# Patient Record
Sex: Female | Born: 1990 | Hispanic: No | Marital: Single | Smoking: Former smoker
Health system: Southern US, Community
[De-identification: ages and names within clinical notes are randomized; demographics above are authoritative.]

## PROBLEM LIST (undated history)

## (undated) DIAGNOSIS — K859 Acute pancreatitis without necrosis or infection, unspecified: Secondary | ICD-10-CM

## (undated) DIAGNOSIS — Z30017 Encounter for initial prescription of implantable subdermal contraceptive: Principal | ICD-10-CM

## (undated) DIAGNOSIS — F32A Depression, unspecified: Secondary | ICD-10-CM

## (undated) DIAGNOSIS — J45909 Unspecified asthma, uncomplicated: Secondary | ICD-10-CM

## (undated) DIAGNOSIS — F329 Major depressive disorder, single episode, unspecified: Secondary | ICD-10-CM

## (undated) DIAGNOSIS — Z975 Presence of (intrauterine) contraceptive device: Secondary | ICD-10-CM

## (undated) DIAGNOSIS — L309 Dermatitis, unspecified: Secondary | ICD-10-CM

## (undated) DIAGNOSIS — Z7689 Persons encountering health services in other specified circumstances: Secondary | ICD-10-CM

## (undated) DIAGNOSIS — E559 Vitamin D deficiency, unspecified: Secondary | ICD-10-CM

## (undated) DIAGNOSIS — R51 Headache: Principal | ICD-10-CM

## (undated) HISTORY — DX: Acute pancreatitis without necrosis or infection, unspecified: K85.90

## (undated) HISTORY — DX: Vitamin D deficiency, unspecified: E55.9

## (undated) HISTORY — DX: Dermatitis, unspecified: L30.9

## (undated) HISTORY — DX: Unspecified asthma, uncomplicated: J45.909

## (undated) HISTORY — DX: Encounter for initial prescription of implantable subdermal contraceptive: Z30.017

## (undated) HISTORY — DX: Headache: R51

## (undated) HISTORY — DX: Presence of (intrauterine) contraceptive device: Z97.5

## (undated) HISTORY — DX: Persons encountering health services in other specified circumstances: Z76.89

---

## 2012-10-15 ENCOUNTER — Encounter: Payer: Self-pay | Admitting: *Deleted

## 2012-10-15 DIAGNOSIS — L309 Dermatitis, unspecified: Secondary | ICD-10-CM

## 2012-10-15 DIAGNOSIS — J45909 Unspecified asthma, uncomplicated: Secondary | ICD-10-CM | POA: Insufficient documentation

## 2012-10-16 ENCOUNTER — Ambulatory Visit: Payer: Self-pay | Admitting: Adult Health

## 2014-01-22 ENCOUNTER — Ambulatory Visit (INDEPENDENT_AMBULATORY_CARE_PROVIDER_SITE_OTHER): Payer: BC Managed Care – PPO | Admitting: Adult Health

## 2014-01-22 ENCOUNTER — Other Ambulatory Visit (HOSPITAL_COMMUNITY)
Admission: RE | Admit: 2014-01-22 | Discharge: 2014-01-22 | Disposition: A | Discharge status: Home / Self Care | Payer: BC Managed Care – PPO | Source: Ambulatory Visit | Attending: Adult Health | Admitting: Adult Health

## 2014-01-22 ENCOUNTER — Encounter: Payer: Self-pay | Admitting: Adult Health

## 2014-01-22 VITALS — BP 118/76 | HR 72 | Ht 64.0 in | Wt 197.0 lb

## 2014-01-22 DIAGNOSIS — Z1212 Encounter for screening for malignant neoplasm of rectum: Secondary | ICD-10-CM

## 2014-01-22 DIAGNOSIS — Z7689 Persons encountering health services in other specified circumstances: Secondary | ICD-10-CM

## 2014-01-22 DIAGNOSIS — Z01419 Encounter for gynecological examination (general) (routine) without abnormal findings: Secondary | ICD-10-CM | POA: Insufficient documentation

## 2014-01-22 DIAGNOSIS — Z3200 Encounter for pregnancy test, result unknown: Secondary | ICD-10-CM

## 2014-01-22 DIAGNOSIS — Z113 Encounter for screening for infections with a predominantly sexual mode of transmission: Secondary | ICD-10-CM | POA: Insufficient documentation

## 2014-01-22 HISTORY — DX: Persons encountering health services in other specified circumstances: Z76.89

## 2014-01-22 LAB — POCT URINE PREGNANCY: Preg Test, Ur: NEGATIVE

## 2014-01-22 MED ORDER — NORETHIN ACE-ETH ESTRAD-FE 1-20 MG-MCG(24) PO CHEW: CHEWABLE_TABLET | ORAL | Status: DC

## 2014-01-22 NOTE — Progress Notes (Signed)
Patient ID: [name], female   DOB: Jun 15, 1991, 23 y.o.   MRN: [id number] History of Present Illness: Erika Lyons is a 23 year old black female, single, in for her first pap and physical.She has had discharge with some itching but stopped using shaving cream and it got better.She started her period at age 23 and it is regular but can be heavy at times and some cramps,has not had sex.Has used tampon,but usually pads and may change hourly on heaviest day.She is thinking of going to [employer].   Current Medications, Allergies, Past Medical History, Past Surgical History, Family History and Social History were reviewed in [facility] electronic medical record.     Review of Systems: Patient denies any daily headaches, blurred vision, shortness of breath, chest pain, abdominal pain, problems with bowel movements, urination, or intercourse. Has not had sex, no joint pian or mood swings, see HPI for positives.    Physical Exam:BP 118/76  Pulse 72  Ht 5\' 4"  (1.626 m)  Wt 197 lb (89.359 kg)  BMI 33.80 kg/m2  LMP 07/27/2015UPT negative General:  Well developed, well nourished, no acute distress Skin:  Warm and dry, has tattoos Neck:  Midline trachea, normal thyroid Lungs; Clear to auscultation bilaterally Breast:  No dominant palpable mass, retraction, or nipple discharge, nipples inverted bilaterally Cardiovascular: Regular rate and rhythm Abdomen:  Soft, non tender, no hepatosplenomegaly Pelvic:  External genitalia is normal in appearance, no lesions.  The vagina is pink,without discahrge.The cervix is smooth and tiny, pap with GC/CHL.  Uterus is felt to be normal size, shape, and contour.  No  adnexal masses or tenderness noted. Extremities:  No swelling or varicosities noted Psych:  No mood changes,alert and cooperative,seems happy Discussed OCs and depo,nexplanon,skyla and ring and she wants to try OCs.  Impression: Yearly gyn exam Period management    Plan: Rx Minastrin disp 1 pack  take 1 daily with 11 refills, gave 1 pack to start today, lot [id number], exp5/16 And discount card,If has sex use condoms Review handout on OC use and nexplanon Return in 3 months for follow up  Physical in 1 year ,pap in 2-3 years if normal

## 2014-01-22 NOTE — Patient Instructions (Signed)
Start OC today take same time daily If has sex use condoms Follow up in 3 months Physical in 1 year Cut carbs and increase exercise Oral Contraception Use Oral contraceptive pills (OCPs) are medicines taken to prevent pregnancy. OCPs work by preventing the ovaries from releasing eggs. The hormones in OCPs also cause the cervical mucus to thicken, preventing the sperm from entering the uterus. The hormones also cause the uterine lining to become thin, not allowing a fertilized egg to attach to the inside of the uterus. OCPs are highly effective when taken exactly as prescribed. However, OCPs do not prevent sexually transmitted diseases (STDs). Safe sex practices, such as using condoms along with an OCP, can help prevent STDs. Before taking OCPs, you may have a physical exam and Pap test. Your health care provider may also order blood tests if necessary. Your health care provider will make sure you are a good candidate for oral contraception. Discuss with your health care provider the possible side effects of the OCP you may be prescribed. When starting an OCP, it can take 2 to 3 months for the body to adjust to the changes in hormone levels in your body.  HOW TO TAKE ORAL CONTRACEPTIVE PILLS Your health care provider may advise you on how to start taking the first cycle of OCPs. Otherwise, you can:   Start on day 1 of your menstrual period. You will not need any backup contraceptive protection with this start time.   Start on the first Sunday after your menstrual period or the day you get your prescription. In these cases, you will need to use backup contraceptive protection for the first week.   Start the pill at any time of your cycle. If you take the pill within 5 days of the start of your period, you are protected against pregnancy right away. In this case, you will not need a backup form of birth control. If you start at any other time of your menstrual cycle, you will need to use another form  of birth control for 7 days. If your OCP is the type called a minipill, it will protect you from pregnancy after taking it for 2 days (48 hours). After you have started taking OCPs:   If you forget to take 1 pill, take it as soon as you remember. Take the next pill at the regular time.   If you miss 2 or more pills, call your health care provider because different pills have different instructions for missed doses. Use backup birth control until your next menstrual period starts.   If you use a 28-day pack that contains inactive pills and you miss 1 of the last 7 pills (pills with no hormones), it will not matter. Throw away the rest of the non-hormone pills and start a new pill pack.  No matter which day you start the OCP, you will always start a new pack on that same day of the week. Have an extra pack of OCPs and a backup contraceptive method available in case you miss some pills or lose your OCP pack.  HOME CARE INSTRUCTIONS   Do not smoke.   Always use a condom to protect against STDs. OCPs do not protect against STDs.   Use a calendar to mark your menstrual period days.   Read the information and directions that came with your OCP. Talk to your health care provider if you have questions.  SEEK MEDICAL CARE IF:   You develop nausea and vomiting.  You have abnormal vaginal discharge or bleeding.   You develop a rash.   You miss your menstrual period.   You are losing your hair.   You need treatment for mood swings or depression.   You get dizzy when taking the OCP.   You develop acne from taking the OCP.   You become pregnant.  SEEK IMMEDIATE MEDICAL CARE IF:   You develop chest pain.   You develop shortness of breath.   You have an uncontrolled or severe headache.   You develop numbness or slurred speech.   You develop visual problems.   You develop pain, redness, and swelling in the legs.  Document Released: 05/26/2011 Document Revised:  10/21/2013 Document Reviewed: 11/25/2012 [facility] Patient Information 2015 [employer] [place]. This information is not intended to replace advice given to you by your health care provider. Make sure you discuss any questions you have with your health care provider.

## 2014-01-24 LAB — CYTOLOGY - PAP

## 2014-03-20 ENCOUNTER — Encounter (HOSPITAL_COMMUNITY): Payer: Self-pay | Admitting: Emergency Medicine

## 2014-03-20 ENCOUNTER — Emergency Department (HOSPITAL_COMMUNITY)
Admission: EM | Admit: 2014-03-20 | Discharge: 2014-03-21 | Disposition: A | Discharge status: Other Acute Inpatient Hospital | Payer: BC Managed Care – PPO | Attending: Emergency Medicine | Admitting: Emergency Medicine

## 2014-03-20 DIAGNOSIS — Z3202 Encounter for pregnancy test, result negative: Secondary | ICD-10-CM | POA: Insufficient documentation

## 2014-03-20 DIAGNOSIS — T368X2A Poisoning by other systemic antibiotics, intentional self-harm, initial encounter: Secondary | ICD-10-CM | POA: Diagnosis not present

## 2014-03-20 DIAGNOSIS — Z79899 Other long term (current) drug therapy: Secondary | ICD-10-CM | POA: Diagnosis not present

## 2014-03-20 DIAGNOSIS — J45909 Unspecified asthma, uncomplicated: Secondary | ICD-10-CM | POA: Insufficient documentation

## 2014-03-20 DIAGNOSIS — T50901A Poisoning by unspecified drugs, medicaments and biological substances, accidental (unintentional), initial encounter: Secondary | ICD-10-CM | POA: Diagnosis present

## 2014-03-20 DIAGNOSIS — F322 Major depressive disorder, single episode, severe without psychotic features: Secondary | ICD-10-CM

## 2014-03-20 DIAGNOSIS — Z872 Personal history of diseases of the skin and subcutaneous tissue: Secondary | ICD-10-CM | POA: Insufficient documentation

## 2014-03-20 DIAGNOSIS — F329 Major depressive disorder, single episode, unspecified: Secondary | ICD-10-CM | POA: Diagnosis present

## 2014-03-20 DIAGNOSIS — T450X2A Poisoning by antiallergic and antiemetic drugs, intentional self-harm, initial encounter: Secondary | ICD-10-CM | POA: Insufficient documentation

## 2014-03-20 DIAGNOSIS — F99 Mental disorder, not otherwise specified: Secondary | ICD-10-CM | POA: Diagnosis present

## 2014-03-20 DIAGNOSIS — T378X2A Poisoning by other specified systemic anti-infectives and antiparasitics, intentional self-harm, initial encounter: Secondary | ICD-10-CM | POA: Insufficient documentation

## 2014-03-20 HISTORY — DX: Depression, unspecified: F32.A

## 2014-03-20 HISTORY — DX: Major depressive disorder, single episode, unspecified: F32.9

## 2014-03-20 LAB — BASIC METABOLIC PANEL
Anion gap: 13 (ref 5–15)
BUN: 8 mg/dL (ref 6–23)
CALCIUM: 8.9 mg/dL (ref 8.4–10.5)
CO2: 24 mEq/L (ref 19–32)
Chloride: 102 mEq/L (ref 96–112)
Creatinine, Ser: 0.85 mg/dL (ref 0.50–1.10)
GFR calc Af Amer: >90 mL/min (ref >90)
Glucose, Bld: 83 mg/dL (ref 70–99)
Potassium: 4.4 mEq/L (ref 3.7–5.3)
Sodium: 139 mEq/L (ref 137–147)

## 2014-03-20 LAB — RAPID URINE DRUG SCREEN, HOSP PERFORMED
AMPHETAMINES: NOT DETECTED
Barbiturates: NOT DETECTED
Benzodiazepines: NOT DETECTED
Cocaine: NOT DETECTED
Opiates: NOT DETECTED
TETRAHYDROCANNABINOL: NOT DETECTED

## 2014-03-20 LAB — COMPREHENSIVE METABOLIC PANEL
ALT: 10 U/L (ref 0–35)
AST: 26 U/L (ref 0–37)
Albumin: 3.9 g/dL (ref 3.5–5.2)
Alkaline Phosphatase: 71 U/L (ref 39–117)
Anion gap: 12 (ref 5–15)
BILIRUBIN TOTAL: 1.7 mg/dL — AB (ref 0.3–1.2)
BUN: 9 mg/dL (ref 6–23)
CHLORIDE: 102 meq/L (ref 96–112)
CO2: 24 mEq/L (ref 19–32)
CREATININE: 0.84 mg/dL (ref 0.50–1.10)
Calcium: 9.2 mg/dL (ref 8.4–10.5)
GFR calc non Af Amer: >90 mL/min (ref >90)
GLUCOSE: 91 mg/dL (ref 70–99)
Potassium: 4.8 mEq/L (ref 3.7–5.3)
Sodium: 138 mEq/L (ref 137–147)
Total Protein: 7.3 g/dL (ref 6.0–8.3)

## 2014-03-20 LAB — URINALYSIS, ROUTINE W REFLEX MICROSCOPIC
Glucose, UA: NEGATIVE mg/dL
Hgb urine dipstick: NEGATIVE
Ketones, ur: >80 mg/dL — AB
Leukocytes, UA: NEGATIVE
Nitrite: NEGATIVE
PROTEIN: NEGATIVE mg/dL
SPECIFIC GRAVITY, URINE: 1.028 (ref 1.005–1.030)
UROBILINOGEN UA: 1 mg/dL (ref 0.0–1.0)
pH: 6.5 (ref 5.0–8.0)

## 2014-03-20 LAB — SALICYLATE LEVEL: Salicylate Lvl: <2 mg/dL — ABNORMAL LOW (ref 2.8–20.0)

## 2014-03-20 LAB — PREGNANCY, URINE: PREG TEST UR: NEGATIVE

## 2014-03-20 LAB — CBC
HEMATOCRIT: 38.6 % (ref 36.0–46.0)
Hemoglobin: 12.7 g/dL (ref 12.0–15.0)
MCH: 28.2 pg (ref 26.0–34.0)
MCHC: 32.9 g/dL (ref 30.0–36.0)
MCV: 85.8 fL (ref 78.0–100.0)
PLATELETS: 307 10*3/uL (ref 150–400)
RBC: 4.5 MIL/uL (ref 3.87–5.11)
RDW: 13.1 % (ref 11.5–15.5)
WBC: 9.6 10*3/uL (ref 4.0–10.5)

## 2014-03-20 LAB — ACETAMINOPHEN LEVEL: Acetaminophen (Tylenol), Serum: <15 ug/mL (ref 10–30)

## 2014-03-20 LAB — ETHANOL: Alcohol, Ethyl (B): <11 mg/dL (ref 0–11)

## 2014-03-20 MED ORDER — TRAZODONE HCL 50 MG PO TABS
50.0000 mg | ORAL_TABLET | Freq: Every evening | ORAL | Status: DC | PRN
  Administered 2014-03-20: 50 mg via ORAL
  Filled 2014-03-20: qty 1

## 2014-03-20 MED ORDER — SODIUM CHLORIDE 0.9 % IV BOLUS (SEPSIS)
1000.0000 mL | Freq: Once | INTRAVENOUS | Status: AC
  Administered 2014-03-20: 1000 mL via INTRAVENOUS

## 2014-03-20 NOTE — ED Notes (Signed)
Stephanie from poison control reports closing case.

## 2014-03-20 NOTE — ED Notes (Signed)
Ice water given to pt °

## 2014-03-20 NOTE — BH Assessment (Signed)
[facility]    Consult requested by Dr Hyacinth MeekerMiller at 1630 who reports the patient is experiencing relationship problems and took a handful of pills.  She took picture of them and then texted it to a friend prior to taking. WHen mother arrived pt was unresponsive.  pt confirms it was a suicide attempt.

## 2014-03-20 NOTE — ED Provider Notes (Signed)
CSN: [id number]     Arrival date & time 03/20/14  1145 History   First MD Initiated Contact with Patient 03/20/14 1153     Chief Complaint  Patient presents with  . Ingestion  . Suicidal     (Consider location/radiation/quality/duration/timing/severity/associated sxs/prior Treatment) HPI Comments: This 23 year old female presents after suicide attempt where she took an overdose of multiple medications, she took a text message picture of her hand holding at least 15 pills of 800 mg Motrin and clindamycin each with 15 tablets, she was found unresponsive on the floor by her mother who went home after receiving a text message picture. The patient was unresponsive on the floor, there were multiple pill bottles which were empty beside her including 5 different antibiotics. The patient states that she is upset, is having trouble, she does not give much more information but the family believes she has recently gone through a breakup possibly. The patient denies any other substance abuse, there has been no aspirin or Tylenol ingestion per the patient's report and this occurred in the last couple of hours. The patient has no psychiatric history including no history of talking about depression suicide or suicide attempts, she did have some comments this morning to a friend which prompted the mother to go home to look for her daughter.  Patient is a 23 y.o. female presenting with Ingested Medication. The history is provided by the patient.  Ingestion    Past Medical History  Diagnosis Date  . Asthma   . Eczema   . Menstrual extraction 01/22/2014   History reviewed. No pertinent past surgical history. Family History  Problem Relation Age of Onset  . Hypertension Other   . Heart disease Maternal Grandfather   . Cancer Paternal Grandmother     breast    History  Substance Use Topics  . Smoking status: Never Smoker   . Smokeless tobacco: Never Used  . Alcohol Use: No     Comment: occ.   OB  History   Grav Para Term Preterm Abortions TAB SAB Ect Mult Living   0 0             Review of Systems  All other systems reviewed and are negative.     Allergies  Review of patient's allergies indicates no known allergies.  Home Medications   Prior to Admission medications   Medication Sig Start Date End Date Taking? Authorizing Provider  Norethin Ace-Eth Estrad-FE (MINASTRIN 24 FE) 1-20 MG-MCG(24) CHEW Take 1 po daily 01/22/14  Yes Adline Potter, NP   BP 98/57  Pulse 61  Temp(Src) 98.3 F (36.8 C) (Oral)  Resp 16  SpO2 98%  LMP 03/06/2014 Physical Exam  Nursing note and vitals reviewed. Constitutional: She appears well-developed and well-nourished. No distress.  HENT:  Head: Normocephalic and atraumatic.  Mouth/Throat: Oropharynx is clear and moist. No oropharyngeal exudate.  Eyes: Conjunctivae and EOM are normal. Pupils are equal, round, and reactive to light. Right eye exhibits no discharge. Left eye exhibits no discharge. No scleral icterus.  Neck: Normal range of motion. Neck supple. No JVD present. No thyromegaly present.  Cardiovascular: Normal rate, regular rhythm, normal heart sounds and intact distal pulses.  Exam reveals no gallop and no friction rub.   No murmur heard. Pulmonary/Chest: Effort normal and breath sounds normal. No respiratory distress. She has no wheezes. She has no rales.  Abdominal: Soft. Bowel sounds are normal. She exhibits no distension and no mass. There is tenderness (mild tenderness to  the right upper quadrant and epigastrium, no guarding).  Musculoskeletal: Normal range of motion. She exhibits no edema and no tenderness.  Lymphadenopathy:    She has no cervical adenopathy.  Neurological: She is alert. Coordination normal.  Skin: Skin is warm and dry. No rash noted. No erythema.  Psychiatric:  Flat affect, depressed, tearful    ED Course  Procedures (including critical care time) Labs Review Labs Reviewed  CBC  COMPREHENSIVE  METABOLIC PANEL  ETHANOL  URINE RAPID DRUG SCREEN (HOSP PERFORMED)  URINALYSIS, ROUTINE W REFLEX MICROSCOPIC  PREGNANCY, URINE  ACETAMINOPHEN LEVEL  SALICYLATE LEVEL    Imaging Review No results found.   EKG Interpretation None      MDM   Final diagnoses:  None    The patient does admit that this was a suicide attempt, she does have a soft blood pressure at 95 systolic but her EKG is unremarkable. Was control recommends labs to be drawn at 3:00, we'll check labs now and then, no signs of QT prolongation, symptomatic control for pain and nausea.  Psych admit anticipated.  Pt was stable throughout initial w/u and ED stay while I was in ED - she tolerate PO, was much more awake and had no GI discomfort after initial orders resulted / meds.  She was d/w TTS who will see in consult.  Anticipate admission.  Meds given in ED:  Medications  traZODone (DESYREL) tablet 50 mg (50 mg Oral Given 03/20/14 2221)  sodium chloride 0.9 % bolus 1,000 mL (0 mLs Intravenous Stopped 03/20/14 1323)        Vida RollerBrian D Kathreen Dileo, MD 03/21/14 1203

## 2014-03-20 NOTE — ED Notes (Signed)
Family reports mother asked pt about open smart seek Clorox. Pt reports "drank a cap full but stopped because it was nasty." Pt did not reports ingestion of Clorox previously. Per poison control keep NPO for next hour. May advance to clear liquid diet if patient free of stridor, vomiting, drooling, or refusal to swallow.

## 2014-03-20 NOTE — ED Notes (Addendum)
Pt reports took antibiotics an hour ago but unsure how much. Pt states "everything going on makes me not what to be here anymore." No previous SI attempt but thoughts of hurting self a few months. Pt denies disruption in support system or threats from others. Pt reports "wants stuff from [place]." Pt reports to staff used to live with roommates but moving home with parents because "does not want to be there anymore.   Pt took lansoprazole, doxycycline, ciprofloxacin, keflex, levocetirizine, bactrim, diflucan, and clindamycin.

## 2014-03-20 NOTE — ED Notes (Signed)
Bed: [id number] Expected date:  Expected time:  Means of arrival:  Comments: OD

## 2014-03-20 NOTE — ED Notes (Signed)
Per EMS pt ingested unknown amount of: ansoprazole, bactrim, diflucan, clindamycin, cipro, keflex, levocetirizine, 800mg  motrin.

## 2014-03-20 NOTE — ED Notes (Signed)
Per EMS pt coming from home with c/o overdose. Pt reportedly took unknown amount of different medications in order to kill herself due to breaking up with girlfriend.

## 2014-03-20 NOTE — ED Notes (Signed)
Pt belongings given to father

## 2014-03-21 ENCOUNTER — Encounter (HOSPITAL_COMMUNITY): Payer: Self-pay | Admitting: *Deleted

## 2014-03-21 DIAGNOSIS — F329 Major depressive disorder, single episode, unspecified: Secondary | ICD-10-CM | POA: Diagnosis present

## 2014-03-21 DIAGNOSIS — T50901A Poisoning by unspecified drugs, medicaments and biological substances, accidental (unintentional), initial encounter: Secondary | ICD-10-CM | POA: Diagnosis present

## 2014-03-21 DIAGNOSIS — R45851 Suicidal ideations: Secondary | ICD-10-CM

## 2014-03-21 MED ORDER — FLUOXETINE HCL 20 MG PO CAPS
20.0000 mg | ORAL_CAPSULE | Freq: Every day | ORAL | Status: DC
  Filled 2014-03-21: qty 1

## 2014-03-21 NOTE — Progress Notes (Signed)
[facility] with [facility] now [facility]. Patient to go to room 2556, transported voluntarily. [facility] to take patient through [facility] entrance and remain with patient until patient is signed in by a behavioral health staff member. [facility]. Patient asked for csw to give directions and contact information to patient mother.   Byrd HesselbachKristen Zakarie Sturdivant, LCSW [phone]  ED CSW 03/21/2014 1424pm

## 2014-03-21 NOTE — BH Assessment (Signed)
Tele Assessment Note   Erika BellKeyona Lyons is a 23 y.o. female who voluntarily presents via EMS transport after taking an overdose on an unk amount of pills. Pt overdosed on ansoprazole, bactrim, diflucan, clindamycin, cipro, keflex, levocetirizine and 800mg  motrin tabs.  Pt reports, she's been SI x1 month and has had a series of events that triggered her emotional state: (1) lost job 10/2013; (2) legal charges-simple assault, communicating threats, injury to personal property; (3) break up with girlfriend-03/20/14.  Pt denies any previous SI attempts and no inpt/outpt services.  Pt admits drinking 1-2 beers at least 2x's a week--"I like to drink".  Pt says "nothing has gone right this year" for her and she doesn't want to be here anymore.  Pt has poor eye contact during interview, looking at the floor.  Pt endorses depressive sxs: crying spells, poor appetite and sleep(only slept 2hrs in the last 48 hrs).    Axis I: Major Depression, single episode Axis II: Deferred Axis III:  Past Medical History  Diagnosis Date  . Asthma   . Eczema   . Menstrual extraction 01/22/2014  . Depression    Axis IV: economic problems, occupational problems, other psychosocial or environmental problems, problems related to legal system/crime, problems related to social environment and problems with primary support group Axis V: 31-40 impairment in reality testing  Past Medical History:  Past Medical History  Diagnosis Date  . Asthma   . Eczema   . Menstrual extraction 01/22/2014  . Depression     History reviewed. No pertinent past surgical history.  Family History:  Family History  Problem Relation Age of Onset  . Hypertension Other   . Heart disease Maternal Grandfather   . Cancer Paternal Grandmother     breast     Social History:  reports that she has never smoked. She has never used smokeless tobacco. She reports that she drinks alcohol. She reports that she does not use illicit drugs.  Additional  Social History:  Alcohol / Drug Use Pain Medications: See MAR  Prescriptions: See MAR  Over the Counter: See MAR  History of alcohol / drug use?: Yes Longest period of sobriety (when/how long): None  Withdrawal Symptoms: Other (Comment) (No w/d sxs ) Substance #1 Name of Substance 1: Alcohol  1 - Age of First Use: Teens  1 - Amount (size/oz): 1-2 Beers  1 - Frequency: 2x's Wkly  1 - Duration: On-going  1 - Last Use / Amount: 03/20/14  CIWA: CIWA-Ar BP: 109/68 mmHg Pulse Rate: 57 COWS:    PATIENT STRENGTHS: (choose at least two) Work skills  Allergies: No Known Allergies  Home Medications:  (Not in a hospital admission)  OB/GYN Status:  Patient's last menstrual period was 03/06/2014.  General Assessment Data Location of Assessment: [facility] Is this a Tele or Face-to-Face Assessment?: Face-to-Face Is this an Initial Assessment or a Re-assessment for this encounter?: Initial Assessment Living Arrangements: Alone Can pt return to current living arrangement?: Yes Admission Status: Voluntary Is patient capable of signing voluntary admission?: Yes Transfer from: Home Referral Source: Self/Family/Friend  Medical Screening Exam [facility] Walk[facility]in ONLY) Medical Exam completed: No Reason for MSE not completed: Other: (None )  [facility] Living Arrangements: Alone Name of Psychiatrist: None  Name of Therapist: None   Education Status Is patient currently in school?: No Current Grade: None  Highest grade of school patient has completed: None  Name of school: None  Contact person: None   Risk to self with  the past 6 months Suicidal Ideation: Yes-Currently Present Suicidal Intent: Yes-Currently Present Is patient at risk for suicide?: Yes Suicidal Plan?: Yes-Currently Present Specify Current Suicidal Plan: Overdose on pills  Access to Means: Yes Specify Access to Suicidal Means: Various pills  What has been your use of drugs/alcohol within the last 12 months?: Pt  denies; admits drinking at least 2x's week  Previous Attempts/Gestures: No How many times?: 0 Other Self Harm Risks: None  Triggers for Past Attempts: None known Intentional Self Injurious Behavior: None Family Suicide History: No Recent stressful life event(s): Conflict (Comment);Job Loss;Financial Problems;Legal Issues (Break up with girlfriend ) Persecutory voices/beliefs?: No Depression: Yes Depression Symptoms: Loss of interest in usual pleasures;Feeling worthless/self pity Substance abuse history and/or treatment for substance abuse?: No Suicide prevention information given to non-admitted patients: Not applicable  Risk to Others within the past 6 months Homicidal Ideation: No Thoughts of Harm to Others: No Current Homicidal Intent: No Current Homicidal Plan: No Access to Homicidal Means: No Identified Victim: None  History of harm to others?: No Assessment of Violence: None Noted Violent Behavior Description: None  Does patient have access to weapons?: No Criminal Charges Pending?: Yes Describe Pending Criminal Charges: Simple assault; Communicating threats; Injury to personal property Does patient have a court date: Yes Court Date:  (Unk )  Psychosis Hallucinations: None noted Delusions: None noted  Mental Status Report Appear/Hygiene: Disheveled;In scrubs Eye Contact: Poor Motor Activity: Unremarkable Speech: Logical/coherent;Soft Level of Consciousness: Alert Mood: Depressed;Sad Affect: Depressed;Sad Anxiety Level: None Thought Processes: Coherent;Relevant Judgement: Impaired Orientation: Person;Place;Time;Situation Obsessive Compulsive Thoughts/Behaviors: None  Cognitive Functioning Concentration: Decreased Memory: Recent Intact;Remote Intact IQ: Average Insight: Poor Impulse Control: Poor Appetite: Fair Weight Loss: 0 Weight Gain: 0 Sleep: Decreased Total Hours of Sleep:  (Only 2hrs sleep x48hrs ) Vegetative Symptoms: None  ADLScreening [facility]  Assessment Services) Patient's cognitive ability adequate to safely complete daily activities?: Yes Patient able to express need for assistance with ADLs?: Yes Independently performs ADLs?: Yes (appropriate for developmental age)  Prior Inpatient Therapy Prior Inpatient Therapy: No Prior Therapy Dates: None  Prior Therapy Facilty/Provider(s): None  Reason for Treatment: None   Prior Outpatient Therapy Prior Outpatient Therapy: No Prior Therapy Dates: None  Prior Therapy Facilty/Provider(s): None  Reason for Treatment: None   ADL Screening (condition at time of admission) Patient's cognitive ability adequate to safely complete daily activities?: Yes Is the patient deaf or have difficulty hearing?: No Does the patient have difficulty seeing, even when wearing glasses/contacts?: No Does the patient have difficulty concentrating, remembering, or making decisions?: No Patient able to express need for assistance with ADLs?: Yes Does the patient have difficulty dressing or bathing?: No Independently performs ADLs?: Yes (appropriate for developmental age) Does the patient have difficulty walking or climbing stairs?: No Weakness of Legs: None Weakness of Arms/Hands: None  Home Assistive Devices/Equipment Home Assistive Devices/Equipment: None  Therapy Consults (therapy consults require a physician order) PT Evaluation Needed: No OT Evalulation Needed: No SLP Evaluation Needed: No Abuse/Neglect Assessment (Assessment to be complete while patient is alone) Physical Abuse: Denies Verbal Abuse: Denies Sexual Abuse: Denies Exploitation of patient/patient's resources: Denies Self-Neglect: Denies Values / Beliefs Cultural Requests During Hospitalization: None Spiritual Requests During Hospitalization: None Consults Spiritual Care Consult Needed: No Social Work Consult Needed: No Merchant navy officer (For Healthcare) Does patient have an advance directive?: No Would patient like  information on creating an advanced directive?: No - patient declined information Nutrition Screen- [facility] Patient's home diet: Regular  Additional Information  1:1 In Past 12 Months?: No CIRT Risk: No Elopement Risk: No Does patient have medical clearance?: Yes     Disposition:  Disposition Initial Assessment Completed for this Encounter: Yes Disposition of Patient: Inpatient treatment program;Referred to Janann August, NP recommend inpt admission ) Type of inpatient treatment program: Adult Patient referred to: Other (Comment) Brett Albino Burkett recommend inpt admission )  Murrell Redden 03/21/2014 5:00 AM

## 2014-03-21 NOTE — Progress Notes (Signed)
[facility] by Dr. Anice PaganiniKhokher. [facility].   Byrd HesselbachKristen Lakita Sahlin, LCSW [phone]  ED CSW 03/21/2014 1227pm

## 2014-03-21 NOTE — Consult Note (Signed)
[facility] Face-to-Face Psychiatry Consult   Reason for Consult:  Overdose Referring Physician:  EDP  Erika Lyons is an 23 y.o. female. Total Time spent with patient: 20 minutes  Assessment: AXIS I:  Major Depression, single episode AXIS II:  Deferred AXIS III:   Past Medical History  Diagnosis Date  . Asthma   . Eczema   . Menstrual extraction 01/22/2014  . Depression    AXIS IV:  other psychosocial or environmental problems, problems related to legal system/crime, problems related to social environment and problems with primary support group AXIS V:  21-30 behavior considerably influenced by delusions or hallucinations OR serious impairment in judgment, communication OR inability to function in almost all areas  Plan:  Recommend psychiatric Inpatient admission when medically cleared. Dr. Darleene Cleaver assessed the patient and concurs with the plan.  Subjective:   Erika Lyons is a 23 y.o. female patient admitted to [facility] ([place] psychiatric unit for stabilization.  HPI:  On admission:  23 y.o. female who voluntarily presents via EMS transport after taking an overdose on an unk amount of pills. Pt overdosed on ansoprazole, bactrim, diflucan, clindamycin, cipro, keflex, levocetirizine and 864m motrin tabs. Pt reports, she's been SI x1 month and has had a series of events that triggered her emotional state: (1) lost job 10/2013; (2) legal charges-simple assault, communicating threats, injury to personal property; (3) break up with girlfriend-03/20/14. Pt denies any previous SI attempts and no inpt/outpt services. Pt admits drinking 1-2 beers at least 2x's a week--"I like to drink". Pt says "nothing has gone right this year" for her and she doesn't want to be here anymore. Pt has poor eye contact during interview, looking at the floor. Pt endorses depressive sxs: crying spells, poor appetite and sleep(only slept 2hrs in the last 48 hrs).  Today:  Patient remains depressed with multiple stressors:   Unemployed, relationship problems, legal charges.  She does admit to overdose in the hopes of dying, visibly depressed with congruent affect. HPI Elements:   Location:  generalized. Quality:  acute. Severity:  severe. Timing:  constant. Duration:  few weeks. Context:  stressors.  Past Psychiatric History: Past Medical History  Diagnosis Date  . Asthma   . Eczema   . Menstrual extraction 01/22/2014  . Depression     reports that she has never smoked. She has never used smokeless tobacco. She reports that she drinks alcohol. She reports that she does not use illicit drugs. Family History  Problem Relation Age of Onset  . Hypertension Other   . Heart disease Maternal Grandfather   . Cancer Paternal Grandmother     breast    Family History Substance Abuse: No Family Supports: Yes, List: (Mother ) Living Arrangements: Alone Can pt return to current living arrangement?: Yes Abuse/Neglect ([facility] Physical Abuse: Denies Verbal Abuse: Denies Sexual Abuse: Denies Allergies:  No Known Allergies  ACT Assessment Complete:  Yes:    Educational Status    Risk to Self: Risk to self with the past 6 months Suicidal Ideation: Yes-Currently Present Suicidal Intent: Yes-Currently Present Is patient at risk for suicide?: Yes Suicidal Plan?: Yes-Currently Present Specify Current Suicidal Plan: Overdose on pills  Access to Means: Yes Specify Access to Suicidal Means: Various pills  What has been your use of drugs/alcohol within the last 12 months?: Pt denies; admits drinking at least 2x's week  Previous Attempts/Gestures: No How many times?: 0 Other Self Harm Risks: None  Triggers for Past Attempts: None known Intentional Self Injurious Behavior: None Family  Suicide History: No Recent stressful life event(s): Conflict (Comment);Job Loss;Financial Problems;Legal Issues (Break up with girlfriend ) Persecutory voices/beliefs?: No Depression: Yes Depression Symptoms: Loss of interest in usual  pleasures;Feeling worthless/self pity Substance abuse history and/or treatment for substance abuse?: No Suicide prevention information given to non-admitted patients: Not applicable  Risk to Others: Risk to Others within the past 6 months Homicidal Ideation: No Thoughts of Harm to Others: No Current Homicidal Intent: No Current Homicidal Plan: No Access to Homicidal Means: No Identified Victim: None  History of harm to others?: No Assessment of Violence: None Noted Violent Behavior Description: None  Does patient have access to weapons?: No Criminal Charges Pending?: Yes Describe Pending Criminal Charges: Simple assault; Communicating threats; Injury to personal property Does patient have a court date: Yes Court Date:  (Unk )  Abuse: Abuse/Neglect Assessment (Assessment to be complete while patient is alone) Physical Abuse: Denies Verbal Abuse: Denies Sexual Abuse: Denies Exploitation of patient/patient's resources: Denies Self-Neglect: Denies  Prior Inpatient Therapy: Prior Inpatient Therapy Prior Inpatient Therapy: No Prior Therapy Dates: None  Prior Therapy Facilty/Provider(s): None  Reason for Treatment: None   Prior Outpatient Therapy: Prior Outpatient Therapy Prior Outpatient Therapy: No Prior Therapy Dates: None  Prior Therapy Facilty/Provider(s): None  Reason for Treatment: None   Additional Information: Additional Information 1:1 In Past 12 Months?: No CIRT Risk: No Elopement Risk: No Does patient have medical clearance?: Yes                  Objective: Blood pressure 114/58, pulse 61, temperature 98.1 F (36.7 C), temperature source Oral, resp. rate 16, last menstrual period 03/06/2014, SpO2 100.00%.There is no weight on file to calculate BMI. Results for orders placed during the hospital encounter of 03/20/14 (from the past 72 hour(s))  CBC     Status: None   Collection Time    03/20/14 12:32 PM      Result Value Ref Range   WBC 9.6  4.0 -  10.5 K/uL   RBC 4.50  3.87 - 5.11 MIL/uL   Hemoglobin 12.7  12.0 - 15.0 g/dL   HCT 38.6  36.0 - 46.0 %   MCV 85.8  78.0 - 100.0 fL   MCH 28.2  26.0 - 34.0 pg   MCHC 32.9  30.0 - 36.0 g/dL   RDW 13.1  11.5 - 15.5 %   Platelets 307  150 - 400 K/uL  COMPREHENSIVE METABOLIC PANEL     Status: Abnormal   Collection Time    03/20/14 12:32 PM      Result Value Ref Range   Sodium 138  137 - 147 mEq/L   Potassium 4.8  3.7 - 5.3 mEq/L   Chloride 102  96 - 112 mEq/L   CO2 24  19 - 32 mEq/L   Glucose, Bld 91  70 - 99 mg/dL   BUN 9  6 - 23 mg/dL   Creatinine, Ser 0.84  0.50 - 1.10 mg/dL   Calcium 9.2  8.4 - 10.5 mg/dL   Total Protein 7.3  6.0 - 8.3 g/dL   Albumin 3.9  3.5 - 5.2 g/dL   AST 26  0 - 37 U/L   Comment: SLIGHT HEMOLYSIS     HEMOLYSIS AT THIS LEVEL MAY AFFECT RESULT   ALT 10  0 - 35 U/L   Alkaline Phosphatase 71  39 - 117 U/L   Total Bilirubin 1.7 (*) 0.3 - 1.2 mg/dL   GFR calc non Af Amer >90  >  90 mL/min   GFR calc Af Amer >90  >90 mL/min   Comment: (NOTE)     The eGFR has been calculated using the CKD EPI equation.     This calculation has not been validated in all clinical situations.     eGFR's persistently <90 mL/min signify possible Chronic Kidney     Disease.   Anion gap 12  5 - 15  ETHANOL     Status: None   Collection Time    03/20/14 12:32 PM      Result Value Ref Range   Alcohol, Ethyl (B) <11  0 - 11 mg/dL   Comment:            LOWEST DETECTABLE LIMIT FOR     SERUM ALCOHOL IS 11 mg/dL     FOR MEDICAL PURPOSES ONLY  ACETAMINOPHEN LEVEL     Status: None   Collection Time    03/20/14 12:32 PM      Result Value Ref Range   Acetaminophen (Tylenol), Serum <15.0  10 - 30 ug/mL   Comment:            THERAPEUTIC CONCENTRATIONS VARY     SIGNIFICANTLY. A RANGE OF 10-30     ug/mL MAY BE AN EFFECTIVE     CONCENTRATION FOR MANY PATIENTS.     HOWEVER, SOME ARE BEST TREATED     AT CONCENTRATIONS OUTSIDE THIS     RANGE.     ACETAMINOPHEN CONCENTRATIONS     >150  ug/mL AT 4 HOURS AFTER     INGESTION AND >50 ug/mL AT 12     HOURS AFTER INGESTION ARE     OFTEN ASSOCIATED WITH TOXIC     REACTIONS.  SALICYLATE LEVEL     Status: Abnormal   Collection Time    03/20/14 12:32 PM      Result Value Ref Range   Salicylate Lvl <3.3 (*) 2.8 - 20.0 mg/dL  URINE RAPID DRUG SCREEN (HOSP PERFORMED)     Status: None   Collection Time    03/20/14  1:33 PM      Result Value Ref Range   Opiates NONE DETECTED  NONE DETECTED   Cocaine NONE DETECTED  NONE DETECTED   Benzodiazepines NONE DETECTED  NONE DETECTED   Amphetamines NONE DETECTED  NONE DETECTED   Tetrahydrocannabinol NONE DETECTED  NONE DETECTED   Barbiturates NONE DETECTED  NONE DETECTED   Comment:            DRUG SCREEN FOR MEDICAL PURPOSES     ONLY.  IF CONFIRMATION IS NEEDED     FOR ANY PURPOSE, NOTIFY LAB     WITHIN 5 DAYS.                LOWEST DETECTABLE LIMITS     FOR URINE DRUG SCREEN     Drug Class       Cutoff (ng/mL)     Amphetamine      1000     Barbiturate      200     Benzodiazepine   354     Tricyclics       562     Opiates          300     Cocaine          300     THC              50  URINALYSIS, ROUTINE W REFLEX MICROSCOPIC  Status: Abnormal   Collection Time    03/20/14  1:33 PM      Result Value Ref Range   Color, Urine AMBER (*) YELLOW   Comment: BIOCHEMICALS MAY BE AFFECTED BY COLOR   APPearance CLEAR  CLEAR   Specific Gravity, Urine 1.028  1.005 - 1.030   pH 6.5  5.0 - 8.0   Glucose, UA NEGATIVE  NEGATIVE mg/dL   Hgb urine dipstick NEGATIVE  NEGATIVE   Bilirubin Urine SMALL (*) NEGATIVE   Ketones, ur >80 (*) NEGATIVE mg/dL   Protein, ur NEGATIVE  NEGATIVE mg/dL   Urobilinogen, UA 1.0  0.0 - 1.0 mg/dL   Nitrite NEGATIVE  NEGATIVE   Leukocytes, UA NEGATIVE  NEGATIVE   Comment: MICROSCOPIC NOT DONE ON URINES WITH NEGATIVE PROTEIN, BLOOD, LEUKOCYTES, NITRITE, OR GLUCOSE <1000 mg/dL.  PREGNANCY, URINE     Status: None   Collection Time    03/20/14  1:33 PM       Result Value Ref Range   Preg Test, Ur NEGATIVE  NEGATIVE   Comment:            THE SENSITIVITY OF THIS     METHODOLOGY IS >20 mIU/mL.  BASIC METABOLIC PANEL     Status: None   Collection Time    03/20/14  4:14 PM      Result Value Ref Range   Sodium 139  137 - 147 mEq/L   Potassium 4.4  3.7 - 5.3 mEq/L   Chloride 102  96 - 112 mEq/L   CO2 24  19 - 32 mEq/L   Glucose, Bld 83  70 - 99 mg/dL   BUN 8  6 - 23 mg/dL   Creatinine, Ser 0.85  0.50 - 1.10 mg/dL   Calcium 8.9  8.4 - 10.5 mg/dL   GFR calc non Af Amer >90  >90 mL/min   GFR calc Af Amer >90  >90 mL/min   Comment: (NOTE)     The eGFR has been calculated using the CKD EPI equation.     This calculation has not been validated in all clinical situations.     eGFR's persistently <90 mL/min signify possible Chronic Kidney     Disease.   Anion gap 13  5 - 15  ACETAMINOPHEN LEVEL     Status: None   Collection Time    03/20/14  4:14 PM      Result Value Ref Range   Acetaminophen (Tylenol), Serum <15.0  10 - 30 ug/mL   Comment:            THERAPEUTIC CONCENTRATIONS VARY     SIGNIFICANTLY. A RANGE OF 10-30     ug/mL MAY BE AN EFFECTIVE     CONCENTRATION FOR MANY PATIENTS.     HOWEVER, SOME ARE BEST TREATED     AT CONCENTRATIONS OUTSIDE THIS     RANGE.     ACETAMINOPHEN CONCENTRATIONS     >150 ug/mL AT 4 HOURS AFTER     INGESTION AND >50 ug/mL AT 12     HOURS AFTER INGESTION ARE     OFTEN ASSOCIATED WITH TOXIC     REACTIONS.   Labs are reviewed and are pertinent for no medical issues noted.  Current Facility-Administered Medications  Medication Dose Route Frequency Provider Last Rate Last Dose  . FLUoxetine (PROZAC) capsule 20 mg  20 mg Oral Daily Waylan Boga, NP      . traZODone (DESYREL) tablet 50 mg  50 mg Oral QHS PRN,MR  X 1 Evanna Cori Greig Castilla, NP   50 mg at 03/20/14 2221   Current Outpatient Prescriptions  Medication Sig Dispense Refill  . Ibuprofen-Diphenhydramine Cit (IBUPROFEN PM PO) Take 2 tablets by mouth  every 8 (eight) hours as needed (pain).      . Norethin Ace-Eth Estrad-FE (MINASTRIN 24 FE) 1-20 MG-MCG(24) CHEW Take 1 po daily  28 tablet  11    Psychiatric Specialty Exam:     Blood pressure 114/58, pulse 61, temperature 98.1 F (36.7 C), temperature source Oral, resp. rate 16, last menstrual period 03/06/2014, SpO2 100.00%.There is no weight on file to calculate BMI.  General Appearance: Casual  Eye Contact::  Poor  Speech:  Normal Rate  Volume:  Normal  Mood:  Depressed  Affect:  Congruent  Thought Process:  Coherent  Orientation:  Full (Time, Place, and Person)  Thought Content:  Rumination  Suicidal Thoughts:  Yes.  with intent/plan  Homicidal Thoughts:  No  Memory:  Immediate;   Fair Recent;   Fair Remote;   Fair  Judgement:  Poor  Insight:  Fair  Psychomotor Activity:  Decreased  Concentration:  Fair  Recall:  [employer] [place]: Fair  Akathisia:  No  Handed:  Right  AIMS (if indicated):     Assets:  Desire for Improvement Physical Health Resilience Social Support  Sleep:      Musculoskeletal: Strength & Muscle Tone: within normal limits Gait & Station: normal Patient leans: N/A  Treatment Plan Summary: Daily contact with patient to assess and evaluate symptoms and progress in treatment Medication management; Prozac 20 mg daily for depression and Trazodone 50 mg at bedtime for sleep, transfer to psychiatric inpatient at [facility] [place] 03/21/2014 12:56 PM  Patient seen, evaluated and I agree with notes by Nurse Practitioner. Corena Pilgrim, MD

## 2014-03-21 NOTE — Progress Notes (Signed)
  CARE MANAGEMENT ED NOTE 03/21/2014  Patient:  Erika Lyons,Erika Lyons   Account Number:  [id number]  Date Initiated:  03/21/2014  Documentation initiated by:  Radford PaxFERRERO,Therese Rocco  Subjective/Objective Assessment:   Patient presents to ED with overdose.     Subjective/Objective Assessment Detail:   Per EMS pt ingested unknown amount of: ansoprazole, bactrim, diflucan, clindamycin, cipro, keflex, levocetirizine, 800mg  motrin due to breaking up with her boyfriend     Action/Plan:   TTS consult   Action/Plan Detail:   Anticipated DC Date:       Status Recommendation to Physician:   Result of Recommendation:    Other ED Services  Consult Working Plan    DC Planning Services  PCP issues  Other    Choice offered to / List presented to:            Status of service:  Completed, signed off  ED Comments:   ED Comments Detail:  EDCM spoke to patient at bedside.  Patient confirms she has [employer] insurance without a pcp.  EDCM provided patient with list of pcps who accept BCBS insurnace within a ten mile radius of patient's zip code.  Discussed importance of finding a pcp.  Patient thankful for resources.  No further EDCM needs at this time.

## 2014-04-24 ENCOUNTER — Other Ambulatory Visit: Payer: BC Managed Care – PPO | Admitting: Adult Health

## 2014-06-26 ENCOUNTER — Ambulatory Visit (INDEPENDENT_AMBULATORY_CARE_PROVIDER_SITE_OTHER): Payer: BLUE CROSS/BLUE SHIELD | Admitting: Adult Health

## 2014-06-26 ENCOUNTER — Encounter: Payer: Self-pay | Admitting: Adult Health

## 2014-06-26 VITALS — BP 100/74 | Ht 64.0 in | Wt 197.0 lb

## 2014-06-26 DIAGNOSIS — Z30018 Encounter for initial prescription of other contraceptives: Secondary | ICD-10-CM

## 2014-06-26 NOTE — Patient Instructions (Signed)
Return 1/18 for nexpalnon insertion Etonogestrel implant What is this medicine? ETONOGESTREL (et oh noe JES trel) is a contraceptive (birth control) device. It is used to prevent pregnancy. It can be used for up to 3 years. This medicine may be used for other purposes; ask your health care provider or pharmacist if you have questions. COMMON BRAND NAME(S): Implanon, Nexplanon What should I tell my health care provider before I take this medicine? They need to know if you have any of these conditions: -abnormal vaginal bleeding -blood vessel disease or blood clots -cancer of the breast, cervix, or liver -depression -diabetes -gallbladder disease -headaches -heart disease or recent heart attack -high blood pressure -high cholesterol -kidney disease -liver disease -renal disease -seizures -tobacco smoker -an unusual or allergic reaction to etonogestrel, other hormones, anesthetics or antiseptics, medicines, foods, dyes, or preservatives -pregnant or trying to get pregnant -breast-feeding How should I use this medicine? This device is inserted just under the skin on the inner side of your upper arm by a health care professional. Talk to your pediatrician regarding the use of this medicine in children. Special care may be needed. Overdosage: If you think you've taken too much of this medicine contact a poison control center or emergency room at once. Overdosage: If you think you have taken too much of this medicine contact a poison control center or emergency room at once. NOTE: This medicine is only for you. Do not share this medicine with others. What if I miss a dose? This does not apply. What may interact with this medicine? Do not take this medicine with any of the following medications: -amprenavir -bosentan -fosamprenavir This medicine may also interact with the following medications: -barbiturate medicines for inducing sleep or treating seizures -certain medicines for  fungal infections like ketoconazole and itraconazole -griseofulvin -medicines to treat seizures like carbamazepine, felbamate, oxcarbazepine, phenytoin, topiramate -modafinil -phenylbutazone -rifampin -some medicines to treat HIV infection like atazanavir, indinavir, lopinavir, nelfinavir, tipranavir, ritonavir -St. John's wort This list may not describe all possible interactions. Give your health care provider a list of all the medicines, herbs, non-prescription drugs, or dietary supplements you use. Also tell them if you smoke, drink alcohol, or use illegal drugs. Some items may interact with your medicine. What should I watch for while using this medicine? This product does not protect you against HIV infection (AIDS) or other sexually transmitted diseases. You should be able to feel the implant by pressing your fingertips over the skin where it was inserted. Tell your doctor if you cannot feel the implant. What side effects may I notice from receiving this medicine? Side effects that you should report to your doctor or health care professional as soon as possible: -allergic reactions like skin rash, itching or hives, swelling of the face, lips, or tongue -breast lumps -changes in vision -confusion, trouble speaking or understanding -dark urine -depressed mood -general ill feeling or flu-like symptoms -light-colored stools -loss of appetite, nausea -right upper belly pain -severe headaches -severe pain, swelling, or tenderness in the abdomen -shortness of breath, chest pain, swelling in a leg -signs of pregnancy -sudden numbness or weakness of the face, arm or leg -trouble walking, dizziness, loss of balance or coordination -unusual vaginal bleeding, discharge -unusually weak or tired -yellowing of the eyes or skin Side effects that usually do not require medical attention (Report these to your doctor or health care professional if they continue or are  bothersome.): -acne -breast pain -changes in weight -cough -fever or chills -headache -irregular  menstrual bleeding -itching, burning, and vaginal discharge -pain or difficulty passing urine -sore throat This list may not describe all possible side effects. Call your doctor for medical advice about side effects. You may report side effects to FDA at 1-800-FDA-1088. Where should I keep my medicine? This drug is given in a hospital or clinic and will not be stored at home. NOTE: This sheet is a summary. It may not cover all possible information. If you have questions about this medicine, talk to your doctor, pharmacist, or health care provider.  2015, Elsevier/Gold Standard. (2011-12-12 15:37:45)

## 2014-06-26 NOTE — Progress Notes (Signed)
Subjective:     Patient ID: Erika Lyons, female   DOB: May 14, 1991, 24 y.o.   MRN: [id number]  HPI Erika Lyons is a [age] year old black female in to discuss birth control, she was on the pill for about a month and forget to take it, she wants nexplanon.She has not had sex yet, but wants to be ready.  Review of Systems See HPI Reviewed past medical,surgical, social and family history. Reviewed medications and allergies.     Objective:   Physical Exam BP 100/74 mmHg  Ht 5\' 4"  (1.626 m)  Wt 197 lb (89.359 kg)  BMI 33.80 kg/m2  LMP 06/03/2014   Discussed nexplanon, will insert on period, and will get insurance checked for her.Discussed to always use a condom.  Assessment:     Contraceptive management    Plan:     Return 1/18 for nexplanon insertion with me Review handout on nexplanon

## 2014-06-30 ENCOUNTER — Telehealth: Payer: Self-pay | Admitting: *Deleted

## 2014-06-30 NOTE — Telephone Encounter (Signed)
Spoke with pt. Pt is scheduled to get Nexplanon 07/07/14, but her period started today and she won't be on period on 07/07/14. Appt is now scheduled for 07/01/14 at 11. Pt voiced understanding. JSY

## 2014-07-01 ENCOUNTER — Encounter: Payer: Self-pay | Admitting: Adult Health

## 2014-07-01 ENCOUNTER — Ambulatory Visit (INDEPENDENT_AMBULATORY_CARE_PROVIDER_SITE_OTHER): Payer: BLUE CROSS/BLUE SHIELD | Admitting: Adult Health

## 2014-07-01 VITALS — BP 108/80 | Ht 65.0 in | Wt 198.0 lb

## 2014-07-01 DIAGNOSIS — Z30017 Encounter for initial prescription of implantable subdermal contraceptive: Secondary | ICD-10-CM | POA: Insufficient documentation

## 2014-07-01 DIAGNOSIS — Z3202 Encounter for pregnancy test, result negative: Secondary | ICD-10-CM

## 2014-07-01 DIAGNOSIS — Z3049 Encounter for surveillance of other contraceptives: Secondary | ICD-10-CM

## 2014-07-01 HISTORY — DX: Encounter for initial prescription of implantable subdermal contraceptive: Z30.017

## 2014-07-01 LAB — POCT URINE PREGNANCY: PREG TEST UR: NEGATIVE

## 2014-07-01 NOTE — Patient Instructions (Signed)
Use condoms, keep clean and dry x 24 hours, no heavy lifting, keep steri strips on x 72 hours, Keep pressure dressing on x 24 hours. Follow up prn problems.  

## 2014-07-01 NOTE — Progress Notes (Signed)
Subjective:     Patient ID: Erika BellKeyona Lyons, female   DOB: 22-Aug-1990, 22 y.o.   MRN: [id number]  HPI Erika Lyons is a 24 year old black female in for nexplanon insertion.  Review of Systems See HPI Reviewed past medical,surgical, social and family history. Reviewed medications and allergies.     Objective:   Physical Exam BP 108/80 mmHg  Ht 5\' 5"  (1.651 m)  Wt 198 lb (89.812 kg)  BMI 32.95 kg/m2  LMP 01/11/2016UPT neagitve,  Consent signed, time out called.Left arm cleansed with betadine, and injected with 1.5 cc 1% lidocaine and waited til numb. Nexplanon easily inserted and steri strips applied. Easily palpated by pt and provider.Pressure dressing applied.     Assessment:     Nexplanon insertion lot # [id number] exp. 3/18    Plan:    Use condoms, keep clean and dry x 24 hours, no heavy lifting, keep steri strips on x 72 hours, Keep pressure dressing on x 24 hours. Follow up prn problems.

## 2014-07-07 ENCOUNTER — Encounter: Payer: BLUE CROSS/BLUE SHIELD | Admitting: Adult Health

## 2015-03-09 ENCOUNTER — Encounter: Payer: Self-pay | Admitting: Adult Health

## 2015-03-09 ENCOUNTER — Ambulatory Visit (INDEPENDENT_AMBULATORY_CARE_PROVIDER_SITE_OTHER): Payer: BLUE CROSS/BLUE SHIELD | Admitting: Adult Health

## 2015-03-09 VITALS — BP 110/62 | HR 64 | Ht 64.5 in | Wt 225.0 lb

## 2015-03-09 DIAGNOSIS — Z01419 Encounter for gynecological examination (general) (routine) without abnormal findings: Secondary | ICD-10-CM | POA: Diagnosis not present

## 2015-03-09 DIAGNOSIS — Z975 Presence of (intrauterine) contraceptive device: Secondary | ICD-10-CM

## 2015-03-09 HISTORY — DX: Presence of (intrauterine) contraceptive device: Z97.5

## 2015-03-09 NOTE — Progress Notes (Signed)
Patient ID: Erika Lyons, female   DOB: Oct 22, 1990, 24 y.o.   MRN: [id number] History of Present Illness: Erika Lyons is a 24 year old black female in for a well woman gyn exam.She had a normal pap 01/22/14. No complaints.  Current Medications, Allergies, Past Medical History, Past Surgical History, Family History and Social History were reviewed in [employer] record.     Review of Systems: Patient denies any headaches, hearing loss, fatigue, blurred vision, shortness of breath, chest pain, abdominal pain, problems with bowel movements, urination, or intercourse(not having sex). No joint pain or mood swings.    Physical Exam:BP 110/62 mmHg  Pulse 64  Ht 5' 4.5" (1.638 m)  Wt 225 lb (102.059 kg)  BMI 38.04 kg/m2  LMP 02/02/2015 General:  Well developed, well nourished, no acute distress Skin:  Warm and dry Neck:  Midline trachea, normal thyroid, good ROM, no lymphadenopathy Lungs; Clear to auscultation bilaterally Breast:  No dominant palpable mass, retraction, or nipple discharge Cardiovascular: Regular rate and rhythm Abdomen:  Soft, non tender, no hepatosplenomegaly Pelvic:  External genitalia is normal in appearance, no lesions.  The vagina is normal in appearance. Urethra has no lesions or masses. The cervix is smooth and tiny.  Uterus is felt to be normal size, shape, and contour.  No adnexal masses or tenderness noted.Bladder is non tender, no masses felt. Extremities/musculoskeletal:  No swelling or varicosities noted, no clubbing or cyanosis, nexplanon left arm easily palpated,gets sore if lifts a lot at work. Psych:  No mood changes, alert and cooperative,seems happy   Impression: Well woman gyn exam no pap nexplanon in place    Plan: Pap and physical in 1 year

## 2015-03-09 NOTE — Patient Instructions (Signed)
Pap and physical in 1 year  

## 2016-01-18 ENCOUNTER — Ambulatory Visit (INDEPENDENT_AMBULATORY_CARE_PROVIDER_SITE_OTHER): Payer: BLUE CROSS/BLUE SHIELD | Admitting: Adult Health

## 2016-01-18 ENCOUNTER — Encounter: Payer: Self-pay | Admitting: Adult Health

## 2016-01-18 VITALS — BP 118/74 | HR 72 | Ht 65.0 in | Wt 233.0 lb

## 2016-01-18 DIAGNOSIS — Z975 Presence of (intrauterine) contraceptive device: Secondary | ICD-10-CM

## 2016-01-18 DIAGNOSIS — Z139 Encounter for screening, unspecified: Secondary | ICD-10-CM

## 2016-01-18 DIAGNOSIS — H538 Other visual disturbances: Secondary | ICD-10-CM

## 2016-01-18 DIAGNOSIS — R632 Polyphagia: Secondary | ICD-10-CM | POA: Diagnosis not present

## 2016-01-18 DIAGNOSIS — R51 Headache: Secondary | ICD-10-CM | POA: Diagnosis not present

## 2016-01-18 DIAGNOSIS — R519 Headache, unspecified: Secondary | ICD-10-CM

## 2016-01-18 DIAGNOSIS — R635 Abnormal weight gain: Secondary | ICD-10-CM | POA: Diagnosis not present

## 2016-01-18 HISTORY — DX: Headache, unspecified: R51.9

## 2016-01-18 MED ORDER — BUTALBITAL-APAP-CAFFEINE 50-325-40 MG PO TABS: 1.0000 | ORAL_TABLET | Freq: Four times a day (QID) | ORAL | 1 refills | Status: DC | PRN

## 2016-01-18 NOTE — Patient Instructions (Signed)
General Headache Without Cause A headache is pain or discomfort felt around the head or neck area. The specific cause of a headache may not be found. There are many causes and types of headaches. A few common ones are:  Tension headaches.  Migraine headaches.  Cluster headaches.  Chronic daily headaches. HOME CARE INSTRUCTIONS  Watch your condition for any changes. Take these steps to help with your condition: Managing Pain  Take over-the-counter and prescription medicines only as told by your health care provider.  Lie down in a dark, quiet room when you have a headache.  If directed, apply ice to the head and neck area:  Put ice in a plastic bag.  Place a towel between your skin and the bag.  Leave the ice on for 20 minutes, 2-3 times per day.  Use a heating pad or hot shower to apply heat to the head and neck area as told by your health care provider.  Keep lights dim if bright lights bother you or make your headaches worse. Eating and Drinking  Eat meals on a regular schedule.  Limit alcohol use.  Decrease the amount of caffeine you drink, or stop drinking caffeine. General Instructions  Keep all follow-up visits as told by your health care provider. This is important.  Keep a headache journal to help find out what may trigger your headaches. For example, write down:  What you eat and drink.  How much sleep you get.  Any change to your diet or medicines.  Try massage or other relaxation techniques.  Limit stress.  Sit up straight, and do not tense your muscles.  Do not use tobacco products, including cigarettes, chewing tobacco, or e-cigarettes. If you need help quitting, ask your health care provider.  Exercise regularly as told by your health care provider.  Sleep on a regular schedule. Get 7-9 hours of sleep, or the amount recommended by your health care provider. SEEK MEDICAL CARE IF:   Your symptoms are not helped by medicine.  You have a  headache that is different from the usual headache.  You have nausea or you vomit.  You have a fever. SEEK IMMEDIATE MEDICAL CARE IF:   Your headache becomes severe.  You have repeated vomiting.  You have a stiff neck.  You have a loss of vision.  You have problems with speech.  You have pain in the eye or ear.  You have muscular weakness or loss of muscle control.  You lose your balance or have trouble walking.  You feel faint or pass out.  You have confusion.   This information is not intended to replace advice given to you by your health care provider. Make sure you discuss any questions you have with your health care provider.   Document Released: 06/06/2005 Document Revised: 02/25/2015 Document Reviewed: 09/29/2014 Elsevier Interactive Patient Education 2016 [employer]. Keep log of headache  If increases go to ER

## 2016-01-18 NOTE — Progress Notes (Signed)
Subjective:     Patient ID: Erika Lyons, female   DOB: 1990-11-11, 25 y.o.   MRN: [id number]  HPI Erika Lyons is a 25 year old black female in complaining of headaches on and off for 2 months, seem to be above eyes,some blurred vision at times, no loss of vision, had eye exam about 2 months ago and has same contacts, saw Dr Erika Lyons.She is working 12 hour shifts and says she is hungry all the time with some weight gain. She denies any nausea or light or noise sensitivity. She has missed work and has [employer] with her.She says she take ibuprofen and that helps. She has nexplanon and wonders if that is contributing, some time arm swellings and is tender after working 12 hours, has had for about 2 years now.  Review of Systems + headache + blurred vision, no nausea or light or noise sensitivity  Increased appetite and some weight gain     Reviewed past medical,surgical, social and family history. Reviewed medications and allergies.  Objective:   Physical Exam  BP 118/74 (BP Location: Left Arm, Patient Position: Sitting, Cuff Size: Large)   Pulse 72   Ht 5\' 5"  (1.651 m)   Wt 233 lb (105.7 kg)   LMP 12/03/2015 Comment: on Nexplanon  BMI 38.77 kg/m  Skin warm and dry. Lungs: clear to ausculation bilaterally. Cardiovascular: regular rate and rhythm.CN 2-12 intact.  Will check labs today and try Fioricet and follow up in 1 week if not better will refer to headache wellness center. Nexplanon easily palpated, no swelling noted today.    Assessment:     Headache above eye region  nexplanon in place Increased appetite    Plan:     Check CBC,CMP,TSH and lipids, A1c and vitamin D Rx Fioricet #30 take 1 every 6 hours prn headache with 1 refill Follow up in 1 week  Give FMLA to Erika Lyons

## 2016-01-19 ENCOUNTER — Encounter: Payer: Self-pay | Admitting: Adult Health

## 2016-01-19 ENCOUNTER — Telehealth: Payer: Self-pay | Admitting: *Deleted

## 2016-01-19 ENCOUNTER — Telehealth: Payer: Self-pay | Admitting: Adult Health

## 2016-01-19 DIAGNOSIS — Z029 Encounter for administrative examinations, unspecified: Secondary | ICD-10-CM

## 2016-01-19 DIAGNOSIS — E559 Vitamin D deficiency, unspecified: Secondary | ICD-10-CM

## 2016-01-19 HISTORY — DX: Vitamin D deficiency, unspecified: E55.9

## 2016-01-19 LAB — CBC
Hematocrit: 35.7 % (ref 34.0–46.6)
Hemoglobin: 11.4 g/dL (ref 11.1–15.9)
MCH: 26.7 pg (ref 26.6–33.0)
MCHC: 31.9 g/dL (ref 31.5–35.7)
MCV: 84 fL (ref 79–97)
PLATELETS: 378 10*3/uL (ref 150–379)
RBC: 4.27 x10E6/uL (ref 3.77–5.28)
RDW: 14.5 % (ref 12.3–15.4)
WBC: 10.7 10*3/uL (ref 3.4–10.8)

## 2016-01-19 LAB — COMPREHENSIVE METABOLIC PANEL
A/G RATIO: 1.3 (ref 1.2–2.2)
ALT: 24 IU/L (ref 0–32)
AST: 26 IU/L (ref 0–40)
Albumin: 4.3 g/dL (ref 3.5–5.5)
Alkaline Phosphatase: 82 IU/L (ref 39–117)
BILIRUBIN TOTAL: 0.5 mg/dL (ref 0.0–1.2)
BUN / CREAT RATIO: 16 (ref 9–23)
BUN: 12 mg/dL (ref 6–20)
CHLORIDE: 100 mmol/L (ref 96–106)
CO2: 24 mmol/L (ref 18–29)
Calcium: 9.6 mg/dL (ref 8.7–10.2)
Creatinine, Ser: 0.75 mg/dL (ref 0.57–1.00)
GFR calc non Af Amer: 113 mL/min/{1.73_m2} (ref >59)
GFR, EST AFRICAN AMERICAN: 130 mL/min/{1.73_m2} (ref >59)
GLOBULIN, TOTAL: 3.2 g/dL (ref 1.5–4.5)
Glucose: 86 mg/dL (ref 65–99)
POTASSIUM: 4.6 mmol/L (ref 3.5–5.2)
SODIUM: 140 mmol/L (ref 134–144)
TOTAL PROTEIN: 7.5 g/dL (ref 6.0–8.5)

## 2016-01-19 LAB — VITAMIN D 25 HYDROXY (VIT D DEFICIENCY, FRACTURES): Vit D, 25-Hydroxy: 17.4 ng/mL — ABNORMAL LOW (ref 30.0–100.0)

## 2016-01-19 LAB — LIPID PANEL
CHOL/HDL RATIO: 4.4 ratio (ref 0.0–4.4)
Cholesterol, Total: 157 mg/dL (ref 100–199)
HDL: 36 mg/dL — AB (ref >39)
LDL CALC: 97 mg/dL (ref 0–99)
Triglycerides: 122 mg/dL (ref 0–149)
VLDL Cholesterol Cal: 24 mg/dL (ref 5–40)

## 2016-01-19 LAB — HEMOGLOBIN A1C
Est. average glucose Bld gHb Est-mCnc: 94 mg/dL
Hgb A1c MFr Bld: 4.9 % (ref 4.8–5.6)

## 2016-01-19 LAB — TSH: TSH: 1.73 u[IU]/mL (ref 0.450–4.500)

## 2016-01-19 MED ORDER — CHOLECALCIFEROL 125 MCG (5000 UT) PO CAPS: 5000.0000 [IU] | ORAL_CAPSULE | Freq: Every day | ORAL | Status: DC

## 2016-01-19 NOTE — Telephone Encounter (Signed)
Pt informed FMLA forms completed, waiting providers signature, will fax once signed. Pt verbalized understanding.

## 2016-01-19 NOTE — Telephone Encounter (Signed)
Left message about labs and need to increase exercise to raise HDL and take vitamin D3 5000 IS daily for vitamin D level of 17.6.

## 2016-01-19 NOTE — Telephone Encounter (Signed)
Pt aware of labs and need to take vitamin D 5000 IU daily and exercise more, she took 1 fioricet today for a headache and it worked really well.

## 2016-01-25 ENCOUNTER — Encounter: Payer: Self-pay | Admitting: Adult Health

## 2016-01-25 ENCOUNTER — Ambulatory Visit (INDEPENDENT_AMBULATORY_CARE_PROVIDER_SITE_OTHER): Payer: BLUE CROSS/BLUE SHIELD | Admitting: Adult Health

## 2016-01-25 VITALS — BP 90/60 | HR 68 | Ht 65.0 in | Wt 232.0 lb

## 2016-01-25 DIAGNOSIS — R51 Headache: Secondary | ICD-10-CM | POA: Diagnosis not present

## 2016-01-25 DIAGNOSIS — R519 Headache, unspecified: Secondary | ICD-10-CM

## 2016-01-25 NOTE — Progress Notes (Signed)
Subjective:     Patient ID: Bernadene BellKeyona Letts, female   DOB: 06-04-1991, 25 y.o.   MRN: [id number]  HPI [name] is a 25 year old black female, back in follow up of having headaches, she is better with Fioricet, but wants go ahead with referral to headache wellness center, because she says her aunts and mom have bad headaches.  Review of Systems +headache Reviewed past medical,surgical, social and family history. Reviewed medications and allergies.     Objective:   Physical Exam BP 90/60 (BP Location: Left Arm, Patient Position: Sitting, Cuff Size: Large)   Pulse 68   Ht 5\' 5"  (1.651 m)   Wt 232 lb (105.2 kg)   LMP 12/03/2015 Comment: on Nexplanon  BMI 38.61 kg/m  Skin warm and dry. Lungs: clear to ausculation bilaterally. Cardiovascular: regular rate and rhythm.    Assessment:     Headaches above eyes     Plan:    Use Fioricet only if needs it,  Referred to Headache wellness center Follow up prn

## 2016-01-25 NOTE — Patient Instructions (Signed)
Refer to Headache wellness center

## 2016-03-28 ENCOUNTER — Encounter: Payer: Self-pay | Admitting: Adult Health

## 2016-03-28 ENCOUNTER — Other Ambulatory Visit (HOSPITAL_COMMUNITY)
Admission: RE | Admit: 2016-03-28 | Discharge: 2016-03-28 | Disposition: A | Discharge status: Home / Self Care | Payer: Self-pay | Source: Ambulatory Visit | Attending: Adult Health | Admitting: Adult Health

## 2016-03-28 ENCOUNTER — Ambulatory Visit (INDEPENDENT_AMBULATORY_CARE_PROVIDER_SITE_OTHER): Payer: BLUE CROSS/BLUE SHIELD | Admitting: Adult Health

## 2016-03-28 VITALS — BP 108/62 | HR 60 | Ht 64.0 in | Wt 227.0 lb

## 2016-03-28 DIAGNOSIS — Z113 Encounter for screening for infections with a predominantly sexual mode of transmission: Secondary | ICD-10-CM | POA: Insufficient documentation

## 2016-03-28 DIAGNOSIS — Z01419 Encounter for gynecological examination (general) (routine) without abnormal findings: Secondary | ICD-10-CM

## 2016-03-28 NOTE — Progress Notes (Signed)
Patient ID: Erika Lyons, female   DOB: 01-31-1991, 25 y.o.   MRN: [id number] History of Present Illness: Erika Lyons is a 25 year old black female in for well woman gyn exam and pap, and requests STD screening.   Current Medications, Allergies, Past Medical History, Past Surgical History, Family History and Social History were reviewed in [facility] electronic medical record.     Review of Systems: Patient denies any headaches, hearing loss, fatigue, blurred vision, shortness of breath, chest pain, abdominal pain, problems with bowel movements, urination, or intercourse. No joint pain or mood swings.    Physical Exam:BP 108/62 (BP Location: Left Arm, Patient Position: Sitting, Cuff Size: Large)   Pulse 60   Ht 5\' 4"  (1.626 m)   Wt 227 lb (103 kg)   LMP 03/04/2016 (Approximate)   BMI 38.96 kg/m  General:  Well developed, well nourished, no acute distress Skin:  Warm and dry Neck:  Midline trachea, normal thyroid, good ROM, no lymphadenopathy Lungs; Clear to auscultation bilaterally Breast:  No dominant palpable mass, retraction, or nipple discharge Cardiovascular: Regular rate and rhythm Abdomen:  Soft, non tender, no hepatosplenomegaly Pelvic:  External genitalia is normal in appearance, no lesions.  The vagina is normal in appearance. Urethra has no lesions or masses. The cervix is smooth and tiny, pap with GC/CHL performed.  Uterus is felt to be normal size, shape, and contour.  No adnexal masses or tenderness noted.Bladder is non tender, no masses felt Extremities/musculoskeletal:  No swelling or varicosities noted, no clubbing or cyanosis Psych:  No mood changes, alert and cooperative,seems happy PHQ 9 score 6, but she says she is not depressed just has certain feelings at times, declines med.  Impression:  1. Encounter for gynecological examination with Papanicolaou smear of cervix   2. Screening examination for STD (sexually transmitted disease)      Plan: Check HIV and  RPR Physical in 1 year, pap in 3 if normal  Make sure sex toys are cleaned well, can use condom

## 2016-03-28 NOTE — Patient Instructions (Signed)
Physical in 1 year,pap in 3 if normal 

## 2016-03-29 ENCOUNTER — Encounter: Payer: Self-pay | Admitting: Adult Health

## 2016-03-29 LAB — HIV ANTIBODY (ROUTINE TESTING W REFLEX): HIV SCREEN 4TH GENERATION: NONREACTIVE

## 2016-03-29 LAB — RPR: RPR Ser Ql: NONREACTIVE

## 2016-03-30 LAB — CYTOLOGY - PAP

## 2016-12-29 ENCOUNTER — Encounter (HOSPITAL_COMMUNITY): Payer: Self-pay | Admitting: Emergency Medicine

## 2016-12-29 ENCOUNTER — Emergency Department (HOSPITAL_COMMUNITY): Payer: Self-pay

## 2016-12-29 ENCOUNTER — Emergency Department (HOSPITAL_COMMUNITY)
Admission: EM | Admit: 2016-12-29 | Discharge: 2016-12-29 | Disposition: A | Discharge status: Home / Self Care | Payer: Self-pay | Attending: Emergency Medicine | Admitting: Emergency Medicine

## 2016-12-29 DIAGNOSIS — F17210 Nicotine dependence, cigarettes, uncomplicated: Secondary | ICD-10-CM | POA: Insufficient documentation

## 2016-12-29 DIAGNOSIS — Z79899 Other long term (current) drug therapy: Secondary | ICD-10-CM | POA: Insufficient documentation

## 2016-12-29 DIAGNOSIS — J45909 Unspecified asthma, uncomplicated: Secondary | ICD-10-CM | POA: Insufficient documentation

## 2016-12-29 DIAGNOSIS — M545 Low back pain, unspecified: Secondary | ICD-10-CM

## 2016-12-29 MED ORDER — IBUPROFEN 800 MG PO TABS
800.0000 mg | ORAL_TABLET | Freq: Once | ORAL | Status: AC
Start: 2016-12-29 — End: 2016-12-29
  Administered 2016-12-29: 800 mg via ORAL
  Filled 2016-12-29: qty 1

## 2016-12-29 MED ORDER — CYCLOBENZAPRINE HCL 10 MG PO TABS: 10.0000 mg | ORAL_TABLET | Freq: Two times a day (BID) | ORAL | 0 refills | Status: DC | PRN

## 2016-12-29 MED ORDER — CYCLOBENZAPRINE HCL 10 MG PO TABS
10.0000 mg | ORAL_TABLET | Freq: Once | ORAL | Status: AC
  Administered 2016-12-29: 10 mg via ORAL
  Filled 2016-12-29: qty 1

## 2016-12-29 MED ORDER — MELOXICAM 15 MG PO TABS: 7.5000 mg | ORAL_TABLET | Freq: Every day | ORAL | 0 refills | Status: DC

## 2016-12-29 MED ORDER — MELOXICAM 7.5 MG PO TABS: 7.5000 mg | ORAL_TABLET | Freq: Every day | ORAL | 0 refills | Status: DC

## 2016-12-29 NOTE — Discharge Instructions (Signed)
If you develop new or worsening symptoms including new falls or injuries, fever, chills, numbness, detailing, or weakness, please return to the emergency department for reevaluation. You can take Flexeril twice a day as needed for muscle stiffness and spasms. Please use caution with this medication because it can make you more sleepy so do not take it before you have to drive or go to work. You can take meloxicam once a day to help with pain and inflammation. Please do not take ibuprofen if you're taking this medication. Both of these medications are on the $4 list at [facility]. You can also apply ice to sore areas 15-20 minutes up to 3-4 times a day. As your pain improves, you can start doing low back stretches. If symptoms do not improve over the next couple weeks despite taking Flexeril and meloxicam, please call  and wellness to schedule a follow-up appointment.

## 2016-12-29 NOTE — ED Notes (Signed)
Pt ambulatory and independent at discharge.  Verbalized understanding of discharge instructions 

## 2016-12-29 NOTE — ED Provider Notes (Signed)
[facility]-EMERGENCY DEPT Provider Note   CSN: [id number] Arrival date & time: 12/29/16  1344  By signing my name below, I, Thelma Bargeick Cochran, attest that this documentation has been prepared under the direction and in the presence of Brooke Steinhilber, PA-C. Electronically Signed: Thelma BargeNick Cochran, Scribe. 12/29/16. 3:17 PM.  History   Chief Complaint Chief Complaint  Patient presents with  . Back Pain   The history is provided by the patient. No language interpreter was used.    HPI Comments: Erika Lyons is a 26 y.o. female who presents to the Emergency Department complaining of constant, gradually worsening, sharp, right-sided back pain that shoots up her back that has worsened yesterday. She has associated mild numbness/tingling in her legs but this usually resolves after she walks around. She has tried taking tylenol with no relief. Pt states this is a chronic issue for her since the last few months, but notes it has worsened since yesterday. She states her back pain arises when she sits then tries to stand up, with the pain episodes lasting for about 40 seconds.  She denies hip pain, knee pain, neck pain, urinary/bowel incontinence, fever, chills, and weakness. She further denies any mechanism of injury or fall. Pt has no pertinent medical history or daily medications. Pt notes she works in [employer] and does heavy lifting for her job.  Past Medical History:  Diagnosis Date  . Asthma   . Depression   . Eczema   . Headache above the eye region 01/18/2016  . Menstrual extraction 01/22/2014  . Nexplanon in place 03/09/2015  . Nexplanon insertion 07/01/2014   Inserted left arm 07/01/14 remove 07/01/17  . Vitamin D deficiency 01/19/2016    Patient Active Problem List   Diagnosis Date Noted  . Vitamin D deficiency 01/19/2016  . Headache above the eye region 01/18/2016  . Nexplanon in place 03/09/2015  . Nexplanon insertion 07/01/2014  . Major depression, single episode 03/21/2014  . Overdose 03/21/2014   . Menstrual extraction 01/22/2014  . Asthma 10/15/2012  . Eczema 10/15/2012    History reviewed. No pertinent surgical history.  OB History    Gravida Para Term Preterm AB Living   0 0           SAB TAB Ectopic Multiple Live Births                   Home Medications    Prior to Admission medications   Medication Sig Start Date End Date Taking? Authorizing Provider  butalbital-acetaminophen-caffeine (FIORICET, ESGIC) [phone] MG tablet Take 1 tablet by mouth every 6 (six) hours as needed for headache. 01/18/16   Adline PotterGriffin, Jennifer A, NP  Cholecalciferol 5000 units capsule Take 1 capsule (5,000 Units total) by mouth daily. 01/19/16   Adline PotterGriffin, Jennifer A, NP  cyclobenzaprine (FLEXERIL) 10 MG tablet Take 1 tablet (10 mg total) by mouth 2 (two) times daily as needed for muscle spasms. 12/29/16   Norell Brisbin A, PA-C  Etonogestrel (NEXPLANON [place]) Inject into the skin.    [provider]  meloxicam (MOBIC) 15 MG tablet Take 0.5 tablets (7.5 mg total) by mouth daily. 12/29/16   Tammy Wickliffe A, PA-C  Multiple Vitamin (MULTIVITAMIN) tablet Take 1 tablet by mouth daily.    [provider]    Family History Family History  Problem Relation Age of Onset  . Heart disease Maternal Grandfather   . Cancer Paternal Grandmother        breast   . Hypertension Maternal Grandmother   .  Leukemia Maternal Grandmother   . Hypertension Mother     Social History Social History  Substance Use Topics  . Smoking status: Current Some Day Smoker    Years: 1.00    Types: Cigarettes  . Smokeless tobacco: Never Used  . Alcohol use Yes     Comment: occ.     Allergies   Patient has no known allergies.   Review of Systems Review of Systems  Constitutional: Negative for chills and fever.  Musculoskeletal: Positive for back pain. Negative for arthralgias and neck pain.  Neurological: Positive for numbness. Negative for weakness.   Physical Exam Updated Vital Signs BP 118/67 (BP  Location: Right Arm)   Pulse 71   Temp 98 F (36.7 C) (Oral)   Resp 16   Wt 103 kg (227 lb)   LMP  (LMP Unknown) Comment: preg test waiver signed 12/29/16  SpO2 98%   BMI 38.96 kg/m   Physical Exam  Constitutional: No distress.  HENT:  Head: Normocephalic.  Eyes: Conjunctivae are normal.  Neck: Neck supple.  Cardiovascular: Normal rate and regular rhythm.  Exam reveals no gallop and no friction rub.   No murmur heard. Pulmonary/Chest: Effort normal. No respiratory distress.  Abdominal: Soft. She exhibits no distension.  Musculoskeletal: Normal range of motion. She exhibits tenderness. She exhibits no edema or deformity.  No crepitus. No step-offs. TTP over the lumbar spine and right-sided paraspinal muscles. No left-sided paraspinal muscle tenderness.  No bony midline tenderness of the thoracic or cervical spine. Patient is able to ambulate without difficulty. Bilateral hips and knees are non-tender with FROM. 2+ DP and PT pulses. 5/5 strength of the bilateral lower extremities. No sensory deficits.    Neurological: She is alert.  Skin: Skin is warm. No rash noted.  Psychiatric: Her behavior is normal.  Nursing note and vitals reviewed.    ED Treatments / Results  DIAGNOSTIC STUDIES: Oxygen Saturation is 98% on RA, normal by my interpretation.    COORDINATION OF CARE: 3:17 PM Discussed treatment plan with pt at bedside and pt agreed to plan.  Labs (all labs ordered are listed, but only abnormal results are displayed) Labs Reviewed - No data to display  EKG  EKG Interpretation None       Radiology Dg Lumbar Spine Complete  Result Date: 12/29/2016 CLINICAL DATA:  Constant gradually worsening sharp RIGHT side back pain that shoots upper back, has been occurring for a couple months but worsened yesterday, associated mild numbness and tingling in legs EXAM: LUMBAR SPINE - COMPLETE 4+ VIEW COMPARISON:  None FINDINGS: Osseous mineralization normal. Five non-rib-bearing  lumbar vertebra. Minimal anterior height loss at T11, mild wedging, without definite acute fracture, question developmental or old. Vertebral body and disc space heights otherwise maintained. No acute fracture, subluxation or bone destruction. SI joints symmetric. IMPRESSION: No definite acute osseous abnormalities. Electronically Signed   By: Ulyses Southward M.D.   On: 12/29/2016 16:11    Procedures Procedures (including critical care time)  Medications Ordered in ED Medications  cyclobenzaprine (FLEXERIL) tablet 10 mg (10 mg Oral Given 12/29/16 1529)  ibuprofen (ADVIL,MOTRIN) tablet 800 mg (800 mg Oral Given 12/29/16 1529)     Initial Impression / Assessment and Plan / ED Course  I have reviewed the triage vital signs and the nursing notes.  Pertinent labs & imaging results that were available during my care of the patient were reviewed by me and considered in my medical decision making (see chart for details).  Patient with back pain, mechanical in nature.  Imaging negative for acute injuries or fractures. No neurological deficits and normal neuro exam.  Patient can walk but states it is painful.  No urinary or bowel incontinence.  No concern for cauda equina, infection, AAA, infection, or fracture.  No constitutional symptoms including fever, chills, or weight loss,  No h/o cancer or IVDU.  Will discharge to home with RICE protocol, Flexeril, and  anti-inflammatory medicine. Discussed ED return precaution and indications for PCP follow up. Provided referral to [facility] and wellness. The patient acknowledges the plan and is agreeable at this time.   Final Clinical Impressions(s) / ED Diagnoses   Final diagnoses:  Acute right-sided low back pain without sciatica    New Prescriptions Current Discharge Medication List    START taking these medications   Details  cyclobenzaprine (FLEXERIL) 10 MG tablet Take 1 tablet (10 mg total) by mouth 2 (two) times daily as needed for muscle  spasms. Qty: 20 tablet, Refills: 0    meloxicam (MOBIC) 15 MG tablet Take 0.5 tablets (7.5 mg total) by mouth daily. Qty: 14 tablet, Refills: 0      I personally performed the services described in this documentation, which was scribed in my presence. The recorded information has been reviewed and is accurate.     Barkley Boards, PA-C 12/29/16 1653    Arby Barrette, MD 01/05/17 1120

## 2016-12-29 NOTE — ED Triage Notes (Signed)
Pt has hx of recurrent back spasms in low back. Current episode has lasted 2 days. Treated with Excedrin , tablets every 6 hours. Pt is alert and ambulatory. Movng slowly due to pain. Friend  at bedside

## 2016-12-29 NOTE — ED Notes (Signed)
Pt declined urine pregnancy and would like to fill out form omitting her from it. Made x-ray aware

## 2017-07-18 ENCOUNTER — Ambulatory Visit: Payer: BLUE CROSS/BLUE SHIELD | Admitting: Advanced Practice Midwife

## 2017-08-01 ENCOUNTER — Encounter: Payer: Self-pay | Admitting: Adult Health

## 2017-08-01 ENCOUNTER — Ambulatory Visit (INDEPENDENT_AMBULATORY_CARE_PROVIDER_SITE_OTHER): Payer: 59 | Admitting: Adult Health

## 2017-08-01 VITALS — BP 106/60 | HR 78 | Ht 65.0 in | Wt 235.8 lb

## 2017-08-01 DIAGNOSIS — Z3046 Encounter for surveillance of implantable subdermal contraceptive: Secondary | ICD-10-CM

## 2017-08-01 DIAGNOSIS — Z3049 Encounter for surveillance of other contraceptives: Secondary | ICD-10-CM | POA: Diagnosis not present

## 2017-08-01 DIAGNOSIS — Z3202 Encounter for pregnancy test, result negative: Secondary | ICD-10-CM | POA: Diagnosis not present

## 2017-08-01 DIAGNOSIS — Z30017 Encounter for initial prescription of implantable subdermal contraceptive: Secondary | ICD-10-CM

## 2017-08-01 LAB — POCT URINE PREGNANCY: Preg Test, Ur: NEGATIVE

## 2017-08-01 MED ORDER — ETONOGESTREL 68 MG ~~LOC~~ IMPL
68.0000 mg | DRUG_IMPLANT | Freq: Once | SUBCUTANEOUS | Status: AC
  Administered 2017-08-01: 68 mg via SUBCUTANEOUS

## 2017-08-01 NOTE — Patient Instructions (Addendum)
Use condoms, keep clean and dry x 24 hours, no heavy lifting, keep steri strips on x 72 hours, Keep pressure dressing on x 24 hours. Follow up prn problems.  

## 2017-08-01 NOTE — Progress Notes (Signed)
Subjective:     Patient ID: Erika Lyons, female   DOB: 1990-06-22, 27 y.o.   MRN: [id number]  HPI Erika SealKeyona is a 27 year old black female in for nexplanon removal and reinsertion of nexplanon.Has female partner. And her partner would like to get pregnant.   Review of Systems For nexplanon removal and reinsertion Reviewed past medical,surgical, social and family history. Reviewed medications and allergies.     Objective:   Physical Exam BP 106/60 (BP Location: Left Arm, Patient Position: Sitting, Cuff Size: Normal)   Pulse 78   Ht 5\' 5"  (1.651 m)   Wt 235 lb 12.8 oz (107 kg)   LMP 07/01/2017 (Approximate)   BMI 39.24 kg/m UPT negative.Consent signed and time out called. Left arm cleansed with betadine, and injected with 1.5 cc 1% lidocaine and waited til numb.Under sterile technique a #11 blade was used to make small vertical incision, and a curved forceps was used to easily remove rod. Then nexplanon rod easily inserted,steri strips applied and rod easily palpated and pressure dressing applied.     Assessment:     Nexplanon removal Nexplanon insertion lot [id number] Exp 11/2019    Plan:     Use condoms, keep clean and dry x 24 hours, no heavy lifting, keep steri strips on x 72 hours, Keep pressure dressing on x 24 hours. Follow up prn problems.

## 2017-08-15 ENCOUNTER — Other Ambulatory Visit: Payer: BLUE CROSS/BLUE SHIELD | Admitting: Adult Health

## 2017-09-18 ENCOUNTER — Other Ambulatory Visit: Payer: Managed Care, Other (non HMO) | Admitting: Adult Health

## 2017-09-18 ENCOUNTER — Other Ambulatory Visit: Payer: BLUE CROSS/BLUE SHIELD | Admitting: Adult Health

## 2017-10-16 ENCOUNTER — Other Ambulatory Visit: Payer: Managed Care, Other (non HMO) | Admitting: Adult Health

## 2017-11-15 ENCOUNTER — Other Ambulatory Visit: Payer: Managed Care, Other (non HMO) | Admitting: Adult Health

## 2017-12-18 ENCOUNTER — Other Ambulatory Visit: Payer: Managed Care, Other (non HMO) | Admitting: Adult Health

## 2017-12-18 DIAGNOSIS — K859 Acute pancreatitis without necrosis or infection, unspecified: Secondary | ICD-10-CM

## 2017-12-18 HISTORY — DX: Acute pancreatitis without necrosis or infection, unspecified: K85.90

## 2018-01-08 ENCOUNTER — Encounter (HOSPITAL_COMMUNITY): Payer: Self-pay | Admitting: Emergency Medicine

## 2018-01-08 ENCOUNTER — Emergency Department (HOSPITAL_COMMUNITY): Payer: Managed Care, Other (non HMO)

## 2018-01-08 ENCOUNTER — Emergency Department (HOSPITAL_COMMUNITY)
Admission: EM | Admit: 2018-01-08 | Discharge: 2018-01-09 | Disposition: A | Discharge status: Home / Self Care | Payer: Managed Care, Other (non HMO) | Attending: Emergency Medicine | Admitting: Emergency Medicine

## 2018-01-08 DIAGNOSIS — R1012 Left upper quadrant pain: Secondary | ICD-10-CM | POA: Diagnosis present

## 2018-01-08 DIAGNOSIS — J45909 Unspecified asthma, uncomplicated: Secondary | ICD-10-CM | POA: Insufficient documentation

## 2018-01-08 DIAGNOSIS — K8501 Idiopathic acute pancreatitis with uninfected necrosis: Secondary | ICD-10-CM | POA: Diagnosis not present

## 2018-01-08 DIAGNOSIS — Z87891 Personal history of nicotine dependence: Secondary | ICD-10-CM | POA: Diagnosis not present

## 2018-01-08 LAB — COMPREHENSIVE METABOLIC PANEL
ALT: 32 U/L (ref 0–44)
AST: 22 U/L (ref 15–41)
Albumin: 3.7 g/dL (ref 3.5–5.0)
Alkaline Phosphatase: 73 U/L (ref 38–126)
Anion gap: 10 (ref 5–15)
BUN: 7 mg/dL (ref 6–20)
CHLORIDE: 102 mmol/L (ref 98–111)
CO2: 23 mmol/L (ref 22–32)
Calcium: 9.2 mg/dL (ref 8.9–10.3)
Creatinine, Ser: 0.9 mg/dL (ref 0.44–1.00)
Glucose, Bld: 110 mg/dL — ABNORMAL HIGH (ref 70–99)
POTASSIUM: 3.9 mmol/L (ref 3.5–5.1)
Sodium: 135 mmol/L (ref 135–145)
Total Bilirubin: 1.8 mg/dL — ABNORMAL HIGH (ref 0.3–1.2)
Total Protein: 7.7 g/dL (ref 6.5–8.1)

## 2018-01-08 LAB — I-STAT BETA HCG BLOOD, ED (MC, WL, AP ONLY): I-stat hCG, quantitative: <5 m[IU]/mL (ref <5)

## 2018-01-08 LAB — CBC
HEMATOCRIT: 39.2 % (ref 36.0–46.0)
HEMOGLOBIN: 12.4 g/dL (ref 12.0–15.0)
MCH: 27.6 pg (ref 26.0–34.0)
MCHC: 31.6 g/dL (ref 30.0–36.0)
MCV: 87.1 fL (ref 78.0–100.0)
Platelets: 392 10*3/uL (ref 150–400)
RBC: 4.5 MIL/uL (ref 3.87–5.11)
RDW: 13.2 % (ref 11.5–15.5)
WBC: 15.7 10*3/uL — AB (ref 4.0–10.5)

## 2018-01-08 LAB — URINALYSIS, ROUTINE W REFLEX MICROSCOPIC
BILIRUBIN URINE: NEGATIVE
Glucose, UA: NEGATIVE mg/dL
HGB URINE DIPSTICK: NEGATIVE
KETONES UR: NEGATIVE mg/dL
LEUKOCYTES UA: NEGATIVE
Nitrite: NEGATIVE
PH: 7 (ref 5.0–8.0)
Protein, ur: NEGATIVE mg/dL
SPECIFIC GRAVITY, URINE: 1.018 (ref 1.005–1.030)

## 2018-01-08 LAB — I-STAT TROPONIN, ED: Troponin i, poc: 0 ng/mL (ref 0.00–0.08)

## 2018-01-08 LAB — LIPASE, BLOOD: LIPASE: 39 U/L (ref 11–51)

## 2018-01-08 NOTE — ED Triage Notes (Signed)
Pt reports L sided abd pain X 3 days with radiation into L chest and back. Denies N/V/D.

## 2018-01-09 ENCOUNTER — Emergency Department (HOSPITAL_COMMUNITY): Payer: Managed Care, Other (non HMO)

## 2018-01-09 MED ORDER — MORPHINE SULFATE (PF) 4 MG/ML IV SOLN
4.0000 mg | Freq: Once | INTRAVENOUS | Status: AC
  Administered 2018-01-09: 4 mg via INTRAVENOUS
  Filled 2018-01-09: qty 1

## 2018-01-09 MED ORDER — ONDANSETRON 8 MG PO TBDP: 8.0000 mg | ORAL_TABLET | Freq: Three times a day (TID) | ORAL | 0 refills | Status: DC | PRN

## 2018-01-09 MED ORDER — IOPAMIDOL (ISOVUE-370) INJECTION 76%
INTRAVENOUS | Status: AC
  Filled 2018-01-09: qty 100

## 2018-01-09 MED ORDER — IOHEXOL 300 MG/ML  SOLN
100.0000 mL | Freq: Once | INTRAMUSCULAR | Status: AC | PRN
  Administered 2018-01-09: 100 mL via INTRAVENOUS

## 2018-01-09 MED ORDER — HYDROCODONE-ACETAMINOPHEN 5-325 MG PO TABS: 1.0000 | ORAL_TABLET | ORAL | 0 refills | Status: DC | PRN

## 2018-01-09 MED ORDER — SODIUM CHLORIDE 0.9 % IV BOLUS
1000.0000 mL | Freq: Once | INTRAVENOUS | Status: AC
  Administered 2018-01-09: 1000 mL via INTRAVENOUS

## 2018-01-09 NOTE — ED Notes (Signed)
Pt. To CT via stretcher. 

## 2018-01-09 NOTE — ED Notes (Signed)
Pt. Return from CT via stretcher. 

## 2018-01-09 NOTE — ED Provider Notes (Signed)
[facility] EMERGENCY DEPARTMENT Provider Note   CSN: [id number] Arrival date & time: 01/08/18  2149     History   Chief Complaint Chief Complaint  Patient presents with  . Abdominal Pain    HPI Erika Lyons is a 27 y.o. female.  HPI 27 year old female presents the emergency department left upper quadrant pain over the past 3 days with some radiation into her left chest and back.  Denies nausea vomiting diarrhea.  No fevers or chills.  No dysuria or urinary frequency.  Patient is never had pain or discomfort like this before.  Denies heavy NSAID use.  Denies daily alcohol use.  Pain is moderate in severity.  No diarrhea.  No blood in her stool.  Moving her bowels normally.  Decreased oral intake today.  No prior history of gallstones.   Past Medical History:  Diagnosis Date  . Asthma   . Depression   . Eczema   . Headache above the eye region 01/18/2016  . Menstrual extraction 01/22/2014  . Nexplanon in place 03/09/2015  . Nexplanon insertion 07/01/2014   Inserted left arm 07/01/14 remove 07/01/17  . Vitamin D deficiency 01/19/2016    Patient Active Problem List   Diagnosis Date Noted  . Vitamin D deficiency 01/19/2016  . Headache above the eye region 01/18/2016  . Nexplanon in place 03/09/2015  . Nexplanon insertion 07/01/2014  . Major depression, single episode 03/21/2014  . Overdose 03/21/2014  . Menstrual extraction 01/22/2014  . Asthma 10/15/2012  . Eczema 10/15/2012    History reviewed. No pertinent surgical history.   OB History    Gravida  0   Para  0   Term      Preterm      AB      Living        SAB      TAB      Ectopic      Multiple      Live Births               Home Medications    Prior to Admission medications   Medication Sig Start Date End Date Taking? Authorizing Provider  butalbital-acetaminophen-caffeine (FIORICET, ESGIC) [phone] MG tablet Take 1 tablet by mouth every 6 (six) hours as needed for  headache. 01/18/16  Yes Adline Potter, NP  Etonogestrel (NEXPLANON [place]) Inject 1 application into the skin once.    Yes [provider]  HYDROcodone-acetaminophen (NORCO/VICODIN) 5-325 MG tablet Take 1 tablet by mouth every 4 (four) hours as needed for moderate pain. 01/09/18   Azalia Bilis, MD  ondansetron (ZOFRAN ODT) 8 MG disintegrating tablet Take 1 tablet (8 mg total) by mouth every 8 (eight) hours as needed for nausea or vomiting. 01/09/18   Azalia Bilis, MD    Family History Family History  Problem Relation Age of Onset  . Heart disease Maternal Grandfather   . Cancer Paternal Grandmother        breast   . Hypertension Maternal Grandmother   . Leukemia Maternal Grandmother   . Hypertension Mother     Social History Social History   Tobacco Use  . Smoking status: Former Smoker    Years: 1.00    Types: Cigarettes  . Smokeless tobacco: Never Used  Substance Use Topics  . Alcohol use: Yes    Comment: occ.  . Drug use: No     Allergies   Patient has no known allergies.   Review of Systems Review of  Systems  All other systems reviewed and are negative.    Physical Exam Updated Vital Signs BP 125/69   Pulse 78   Temp 99.8 F (37.7 C) (Oral)   Resp 14   Ht 5\' 5"  (1.651 m)   Wt 103 kg (227 lb)   SpO2 98%   BMI 37.77 kg/m   Physical Exam  Constitutional: She is oriented to person, place, and time. She appears well-developed and well-nourished. No distress.  HENT:  Head: Normocephalic and atraumatic.  Eyes: EOM are normal.  Neck: Normal range of motion.  Cardiovascular: Normal rate, regular rhythm and normal heart sounds.  Pulmonary/Chest: Effort normal and breath sounds normal.  Abdominal: Soft. She exhibits no distension.  Mild epigastric and left upper quadrant tenderness without guarding or rebound.  No right upper quadrant tenderness.  Musculoskeletal: Normal range of motion.  Neurological: She is alert and oriented to person, place, and  time.  Skin: Skin is warm and dry.  Psychiatric: She has a normal mood and affect. Judgment normal.  Nursing note and vitals reviewed.    ED Treatments / Results  Labs (all labs ordered are listed, but only abnormal results are displayed) Labs Reviewed  COMPREHENSIVE METABOLIC PANEL - Abnormal; Notable for the following components:      Result Value   Glucose, Bld 110 (*)    Total Bilirubin 1.8 (*)    All other components within normal limits  CBC - Abnormal; Notable for the following components:   WBC 15.7 (*)    All other components within normal limits  LIPASE, BLOOD  URINALYSIS, ROUTINE W REFLEX MICROSCOPIC  I-STAT BETA HCG BLOOD, ED ([facility], [facility], [facility] ONLY)  I-STAT TROPONIN, ED    EKG EKG Interpretation  Date/Time:  Monday January 08 2018 21:56:28 EDT Ventricular Rate:  112 PR Interval:  130 QRS Duration: 78 QT Interval:  324 QTC Calculation: 442 R Axis:   27 Text Interpretation:  Sinus tachycardia Nonspecific T wave abnormality Abnormal ECG No significant change was found Confirmed by Azalia Bilis ([id number]) on 01/09/2018 2:30:44 AM Also confirmed by Azalia Bilis ([id number]), editor Sheppard Evens [phone])  on 01/09/2018 8:24:04 AM   Radiology Dg Chest 2 View  Result Date: 01/08/2018 CLINICAL DATA:  Chest pain EXAM: CHEST - 2 VIEW COMPARISON:  None. FINDINGS: Linear atelectasis or scarring at the bases. No pleural effusion. Heart size upper normal. No pneumothorax. IMPRESSION: Subsegmental atelectasis at both lung bases. Electronically Signed   By: Jasmine Pang M.D.   On: 01/08/2018 22:22   Ct Abdomen Pelvis W Contrast  Result Date: 01/09/2018 CLINICAL DATA:  Left-sided abdominal pain for 3 days radiating to the left chest. EXAM: CT ABDOMEN AND PELVIS WITH CONTRAST TECHNIQUE: Multidetector CT imaging of the abdomen and pelvis was performed using the standard protocol following bolus administration of intravenous contrast. CONTRAST:  OMNIPAQUE IOHEXOL 300 MG/ML  SOLN  COMPARISON:  None. FINDINGS: Lower chest: Atelectasis in both lung bases. Hepatobiliary: No focal liver abnormality is seen. No gallstones, gallbladder wall thickening, or biliary dilatation. Pancreas: Poorly defined low-attenuation in the tail of the pancreas with a focal collection measuring 3 cm diameter. Stranding around the tail of the pancreas. No pancreatic ductal dilatation. Changes likely represent focal acute pancreatitis with an area of developing walled off necrosis. Follow-up after resolution of acute process is recommended to exclude underlying cystic mass. Spleen: Normal in size without focal abnormality. Adrenals/Urinary Tract: No adrenal gland nodules. Kidneys are symmetrical. No hydronephrosis or hydroureter. No solid mass lesions.  Bladder is unremarkable. Stomach/Bowel: Stomach is within normal limits. Appendix is not identified. No evidence of bowel wall thickening, distention, or inflammatory changes. Vascular/Lymphatic: No significant vascular findings are present. No enlarged abdominal or pelvic lymph nodes. Reproductive: Uterus and bilateral adnexa are unremarkable. Other: No abdominal wall hernia or abnormality. No abdominopelvic ascites. Musculoskeletal: No acute or significant osseous findings. IMPRESSION: Poorly defined low-attenuation lesion in the tail of the pancreas with surrounding inflammatory stranding. Changes likely represent focal acute pancreatitis with developing walled off necrosis. Follow-up after resolution of acute process is recommended to exclude underlying cystic mass. Electronically Signed   By: Burman NievesWilliam  Stevens M.D.   On: 01/09/2018 03:35    Procedures Procedures (including critical care time)  Medications Ordered in ED Medications  morphine 4 MG/ML injection 4 mg (4 mg Intravenous Given 01/09/18 0248)  sodium chloride 0.9 % bolus 1,000 mL (0 mLs Intravenous Stopped 01/09/18 0518)  iohexol (OMNIPAQUE) 300 MG/ML solution 100 mL (100 mLs Intravenous Contrast  Given 01/09/18 0316)     Initial Impression / Assessment and Plan / ED Course  I have reviewed the triage vital signs and the nursing notes.  Pertinent labs & imaging results that were available during my care of the patient were reviewed by me and considered in my medical decision making (see chart for details).     Surprisingly she has evidence of acute pancreatitis with focal necrosis.  No pseudocyst formation.  Denies daily alcohol use.  Likely idiopathic pancreatitis.  Patient is overall well-appearing.  She feels much better.  She is tolerating oral fluids.  Recommend a liquid diet for the next 24 hours with slow advancement of diet at home.  Home with pain medication.  She will need outpatient GI follow-up to evaluate for resolution of her symptoms.  She understands return to the emergency department for new or worsening symptoms.  She is young otherwise healthy I do not think she needs additional work-up acutely in the hospital she feels better at this time.  Final Clinical Impressions(s) / ED Diagnoses   Final diagnoses:  Idiopathic acute pancreatitis with uninfected necrosis    ED Discharge Orders        Ordered    HYDROcodone-acetaminophen (NORCO/VICODIN) 5-325 MG tablet  Every 4 hours PRN     01/09/18 0453    ondansetron (ZOFRAN ODT) 8 MG disintegrating tablet  Every 8 hours PRN     01/09/18 0453       Azalia Bilisampos, Jocie Meroney, MD 01/09/18 2347

## 2018-01-11 ENCOUNTER — Emergency Department (HOSPITAL_COMMUNITY)
Admission: EM | Admit: 2018-01-11 | Discharge: 2018-01-11 | Disposition: A | Discharge status: Home / Self Care | Payer: Managed Care, Other (non HMO) | Attending: Emergency Medicine | Admitting: Emergency Medicine

## 2018-01-11 ENCOUNTER — Emergency Department (HOSPITAL_COMMUNITY): Payer: Managed Care, Other (non HMO)

## 2018-01-11 ENCOUNTER — Encounter (HOSPITAL_COMMUNITY): Payer: Self-pay | Admitting: *Deleted

## 2018-01-11 DIAGNOSIS — J45909 Unspecified asthma, uncomplicated: Secondary | ICD-10-CM | POA: Insufficient documentation

## 2018-01-11 DIAGNOSIS — Z79899 Other long term (current) drug therapy: Secondary | ICD-10-CM | POA: Diagnosis not present

## 2018-01-11 DIAGNOSIS — R109 Unspecified abdominal pain: Secondary | ICD-10-CM | POA: Diagnosis present

## 2018-01-11 DIAGNOSIS — Z87891 Personal history of nicotine dependence: Secondary | ICD-10-CM | POA: Diagnosis not present

## 2018-01-11 DIAGNOSIS — R1110 Vomiting, unspecified: Secondary | ICD-10-CM | POA: Insufficient documentation

## 2018-01-11 DIAGNOSIS — R1013 Epigastric pain: Secondary | ICD-10-CM | POA: Insufficient documentation

## 2018-01-11 LAB — COMPREHENSIVE METABOLIC PANEL
ALK PHOS: 104 U/L (ref 38–126)
ALT: 45 U/L — ABNORMAL HIGH (ref 0–44)
ANION GAP: 11 (ref 5–15)
AST: 35 U/L (ref 15–41)
Albumin: 3.5 g/dL (ref 3.5–5.0)
BILIRUBIN TOTAL: 2 mg/dL — AB (ref 0.3–1.2)
BUN: 5 mg/dL — ABNORMAL LOW (ref 6–20)
CALCIUM: 9.2 mg/dL (ref 8.9–10.3)
CHLORIDE: 101 mmol/L (ref 98–111)
CO2: 23 mmol/L (ref 22–32)
CREATININE: 0.85 mg/dL (ref 0.44–1.00)
Glucose, Bld: 130 mg/dL — ABNORMAL HIGH (ref 70–99)
Potassium: 4.6 mmol/L (ref 3.5–5.1)
Sodium: 135 mmol/L (ref 135–145)
Total Protein: 7.8 g/dL (ref 6.5–8.1)

## 2018-01-11 LAB — URINALYSIS, ROUTINE W REFLEX MICROSCOPIC
Bilirubin Urine: NEGATIVE
GLUCOSE, UA: NEGATIVE mg/dL
Hgb urine dipstick: NEGATIVE
KETONES UR: 20 mg/dL — AB
LEUKOCYTES UA: NEGATIVE
Nitrite: NEGATIVE
PH: 5 (ref 5.0–8.0)
Protein, ur: 30 mg/dL — AB
Specific Gravity, Urine: 1.026 (ref 1.005–1.030)

## 2018-01-11 LAB — CBC
HCT: 36.7 % (ref 36.0–46.0)
HEMOGLOBIN: 11.6 g/dL — AB (ref 12.0–15.0)
MCH: 27.6 pg (ref 26.0–34.0)
MCHC: 31.6 g/dL (ref 30.0–36.0)
MCV: 87.2 fL (ref 78.0–100.0)
Platelets: 378 10*3/uL (ref 150–400)
RBC: 4.21 MIL/uL (ref 3.87–5.11)
RDW: 13.1 % (ref 11.5–15.5)
WBC: 13.1 10*3/uL — AB (ref 4.0–10.5)

## 2018-01-11 LAB — I-STAT BETA HCG BLOOD, ED (MC, WL, AP ONLY): I-stat hCG, quantitative: <5 m[IU]/mL (ref <5)

## 2018-01-11 LAB — LIPASE, BLOOD: Lipase: 44 U/L (ref 11–51)

## 2018-01-11 MED ORDER — ONDANSETRON HCL 4 MG/2ML IJ SOLN
4.0000 mg | Freq: Once | INTRAMUSCULAR | Status: AC
Start: 2018-01-11 — End: 2018-01-11
  Administered 2018-01-11: 4 mg via INTRAVENOUS
  Filled 2018-01-11: qty 2

## 2018-01-11 MED ORDER — MORPHINE SULFATE (PF) 4 MG/ML IV SOLN
2.0000 mg | Freq: Once | INTRAVENOUS | Status: AC
  Administered 2018-01-11: 2 mg via INTRAVENOUS
  Filled 2018-01-11: qty 1

## 2018-01-11 MED ORDER — RANITIDINE HCL 150 MG PO TABS: 150.0000 mg | ORAL_TABLET | Freq: Two times a day (BID) | ORAL | 0 refills | Status: DC

## 2018-01-11 MED ORDER — MORPHINE SULFATE 15 MG PO TABS
15.0000 mg | ORAL_TABLET | Freq: Once | ORAL | Status: AC
  Administered 2018-01-11: 15 mg via ORAL
  Filled 2018-01-11: qty 1

## 2018-01-11 MED ORDER — MORPHINE SULFATE 15 MG PO TABS: 15.0000 mg | ORAL_TABLET | Freq: Four times a day (QID) | ORAL | 0 refills | Status: DC | PRN

## 2018-01-11 MED ORDER — LACTATED RINGERS IV BOLUS
1000.0000 mL | Freq: Once | INTRAVENOUS | Status: AC
  Administered 2018-01-11: 1000 mL via INTRAVENOUS

## 2018-01-11 MED ORDER — ALUM & MAG HYDROXIDE-SIMETH 200-200-20 MG/5ML PO SUSP
30.0000 mL | Freq: Once | ORAL | Status: AC
  Administered 2018-01-11: 30 mL via ORAL
  Filled 2018-01-11: qty 30

## 2018-01-11 NOTE — ED Triage Notes (Signed)
Pt in c/o abdominal pain since Monday, seen for same at that time and dx with pancreatitis, states pain continues and she cannot see GI until Monday, also n/v

## 2018-01-11 NOTE — ED Notes (Signed)
Patient transported to Ultrasound 

## 2018-01-11 NOTE — ED Provider Notes (Signed)
MSE was initiated and I personally evaluated the patient and placed orders (if any) at  2:16 PM on January 11, 2018.  The patient appears stable so that the remainder of the MSE may be completed by another provider.  Patient placed in Quick Look pathway, seen and evaluated   Chief Complaint: abdominal pain, nausea, vomiting, constipation  HPI:   [age] year old female presents to ED for evaluation of continued epigastric abdominal pain radiating to back, several episodes of nonbloody, nonbilious emesis for the past 2 days.  She has not had a bowel movement in 5 days.  She was seen and evaluated here in the ED 2 days ago and was diagnosed with acute pancreatitis with noninfectious necrosis.  She was given pain medication and nausea medicine.  States that the pain medicine will last for about 4 hours for her pain returns.  States that "the pain medicine is for moderate pain, my pain is severe."  States that she has had a history of alcohol use but none recently.  Is scheduled to meet with GI next week.  Denies any fever, urinary symptoms.  ROS: Abdominal pain  Physical Exam:   Gen: No distress  Neuro: Awake and Alert  Skin: Warm    Focused Exam: Epigastric abdominal tenderness to palpation without rebound or guarding.   Initiation of care has begun. The patient has been counseled on the process, plan, and necessity for staying for the completion/evaluation, and the remainder of the medical screening examination    Dietrich PatesKhatri, Chalee Hirota, PA-C 01/11/18 1418    Little, Ambrose Finlandachel Morgan, MD 01/12/18 [phone]

## 2018-01-11 NOTE — ED Notes (Signed)
Pt states understanding of discharge instructions, stable, ambulatory 

## 2018-01-11 NOTE — ED Provider Notes (Signed)
[facility] EMERGENCY DEPARTMENT Provider Note   CSN: [id number] Arrival date & time: 01/11/18  1357  History   Chief Complaint Chief Complaint  Patient presents with  . Abdominal Pain   HPI  Patient is a 27 year old female with history of asthma and depression presenting to the ED for abdominal pain.  She states pain began about 5 days ago and has been located in the epigastric region.  She was seen here in the ED 2 days ago for this pain and CT at that time showed area of inflammation and necrosis at the tail of the pancreas with normal lipase.  She was diagnosed with acute pancreatitis and discharged home with advancing diet.  She states she has been taking Norco since that time without adequate relief, and her pain is been worsening.  She also complains of ongoing vomiting.  She has had poor p.o. intake but the pain is not worse postprandially.  No diarrhea and in fact she has not had a bowel movement in a few days. No vaginal bleeding, vaginal discharge, urinary symptoms, or lower abdominal pain.  Past Medical History:  Diagnosis Date  . Asthma   . Depression   . Eczema   . Headache above the eye region 01/18/2016  . Menstrual extraction 01/22/2014  . Nexplanon in place 03/09/2015  . Nexplanon insertion 07/01/2014   Inserted left arm 07/01/14 remove 07/01/17  . Vitamin D deficiency 01/19/2016    Patient Active Problem List   Diagnosis Date Noted  . Vitamin D deficiency 01/19/2016  . Headache above the eye region 01/18/2016  . Nexplanon in place 03/09/2015  . Nexplanon insertion 07/01/2014  . Major depression, single episode 03/21/2014  . Overdose 03/21/2014  . Menstrual extraction 01/22/2014  . Asthma 10/15/2012  . Eczema 10/15/2012    History reviewed. No pertinent surgical history.   OB History    Gravida  0   Para  0   Term      Preterm      AB      Living        SAB      TAB      Ectopic      Multiple      Live Births                Home Medications    Prior to Admission medications   Medication Sig Start Date End Date Taking? Authorizing Provider  Etonogestrel (NEXPLANON ) Inject 1 application into the skin once.    Yes [provider]  ondansetron (ZOFRAN ODT) 8 MG disintegrating tablet Take 1 tablet (8 mg total) by mouth every 8 (eight) hours as needed for nausea or vomiting. 01/09/18  Yes Azalia Bilis, MD  butalbital-acetaminophen-caffeine (FIORICET, ESGIC) 50-325-40 MG tablet Take 1 tablet by mouth every 6 (six) hours as needed for headache. Patient not taking: Reported on 01/11/2018 01/18/16   Cyril Mourning A, NP  morphine (MSIR) 15 MG tablet Take 1 tablet (15 mg total) by mouth every 6 (six) hours as needed for severe pain. 01/11/18   Cecille Po, MD  ranitidine (ZANTAC) 150 MG tablet Take 1 tablet (150 mg total) by mouth 2 (two) times daily for 10 days. 01/11/18 01/21/18  Cecille Po, MD    Family History Family History  Problem Relation Age of Onset  . Heart disease Maternal Grandfather   . Cancer Paternal Grandmother        breast   . Hypertension Maternal  Grandmother   . Leukemia Maternal Grandmother   . Hypertension Mother     Social History Social History   Tobacco Use  . Smoking status: Former Smoker    Years: 1.00    Types: Cigarettes  . Smokeless tobacco: Never Used  Substance Use Topics  . Alcohol use: Yes    Comment: occ.  . Drug use: No     Allergies   Patient has no known allergies.   Review of Systems Review of Systems  Constitutional: Positive for appetite change. Negative for fever.  HENT: Negative for congestion and sore throat.   Eyes: Negative for visual disturbance.  Respiratory: Negative for shortness of breath.   Cardiovascular: Negative for chest pain.  Gastrointestinal: Positive for abdominal pain, constipation and vomiting. Negative for diarrhea.  Genitourinary: Negative for dysuria, vaginal bleeding and vaginal discharge.   Musculoskeletal: Negative for neck pain.  Neurological: Negative for syncope.  All other systems reviewed and are negative.    Physical Exam Updated Vital Signs BP 109/65   Pulse 89   Temp 97.9 F (36.6 C) (Oral)   Resp 18   SpO2 100%   Physical Exam  Constitutional: She is oriented to person, place, and time. No distress.  HENT:  Head: Normocephalic and atraumatic.  Mouth/Throat: Oropharynx is clear and moist.  Eyes: Pupils are equal, round, and reactive to light. Conjunctivae are normal.  Neck: Neck supple. No tracheal deviation present.  Cardiovascular: Normal rate, regular rhythm, normal heart sounds and intact distal pulses.  No murmur heard. Pulmonary/Chest: Effort normal and breath sounds normal. No stridor. No respiratory distress. She has no wheezes. She has no rales.  Abdominal: Soft. She exhibits no distension and no mass. There is tenderness (moderate, LUQ and epigastric). There is no guarding.  Musculoskeletal: She exhibits no edema or deformity.  Neurological: She is alert and oriented to person, place, and time.  Skin: Skin is warm and dry.  Psychiatric: She has a normal mood and affect. Her behavior is normal.  Nursing note and vitals reviewed.    ED Treatments / Results  Labs (all labs ordered are listed, but only abnormal results are displayed) Labs Reviewed  COMPREHENSIVE METABOLIC PANEL - Abnormal; Notable for the following components:      Result Value   Glucose, Bld 130 (*)    BUN 5 (*)    ALT 45 (*)    Total Bilirubin 2.0 (*)    All other components within normal limits  CBC - Abnormal; Notable for the following components:   WBC 13.1 (*)    Hemoglobin 11.6 (*)    All other components within normal limits  URINALYSIS, ROUTINE W REFLEX MICROSCOPIC - Abnormal; Notable for the following components:   Color, Urine AMBER (*)    APPearance HAZY (*)    Ketones, ur 20 (*)    Protein, ur 30 (*)    Bacteria, UA RARE (*)    All other components  within normal limits  LIPASE, BLOOD  I-STAT BETA HCG BLOOD, ED ([facility], [facility], [facility] ONLY)    EKG None  Radiology [place] Abdomen Limited  Result Date: 01/11/2018 CLINICAL DATA:  Right upper quadrant pain EXAM: ULTRASOUND ABDOMEN LIMITED RIGHT UPPER QUADRANT COMPARISON:  CT 01/09/2018 FINDINGS: Gallbladder: No gallstones or wall thickening visualized. No sonographic Murphy sign noted by sonographer. Common bile duct: Diameter: 2.9 mm Liver: No focal lesion identified. Within normal limits in parenchymal echogenicity. Portal vein is patent on color Doppler imaging with normal direction of blood flow  towards the liver. IMPRESSION: Negative right upper quadrant abdominal ultrasound Electronically Signed   By: Jasmine Pang M.D.   On: 01/11/2018 21:06    Procedures Procedures (including critical care time)  Medications Ordered in ED Medications  lactated ringers bolus 1,000 mL (0 mLs Intravenous Stopped 01/11/18 2223)  ondansetron (ZOFRAN) injection 4 mg (4 mg Intravenous Given 01/11/18 2042)  morphine 4 MG/ML injection 2 mg (2 mg Intravenous Given 01/11/18 2045)  alum & mag hydroxide-simeth (MAALOX/MYLANTA) 200-200-20 MG/5ML suspension 30 mL (30 mLs Oral Given 01/11/18 2039)  morphine (MSIR) tablet 15 mg (15 mg Oral Given 01/11/18 2236)     Initial Impression / Assessment and Plan / ED Course  I have reviewed the triage vital signs and the nursing notes.  Pertinent labs & imaging results that were available during my care of the patient were reviewed by me and considered in my medical decision making (see chart for details).  This is a 27 year old female recently seen in the ED and diagnosed with pancreatitis presenting to the ED for ongoing pain.  Patient is well-appearing with soft abdomen.  Clinical picture consistent with ongoing mild pancreatitis.  Her lipase remains normal and her WBC count is downtrending.  She is afebrile.  Her bilirubin is mildly elevated at 2.0, slightly up from prior 1.8.  We  therefore obtained RUQ [place] which showed no acute findings.  Specifically, no evidence of cholelithiasis, choledocholithiasis, or hepatic structural abnormality.  On recheck after IV fluids, analgesia, and Maalox, symptoms improved.  Patient given p.o. challenge and she is tolerating crackers, applesauce, and fluids.  She is tolerating p.o. pain meds as well.  With reassuring findings, do not feel repeat CT is warranted.  She has no lower abdominal pain or tenderness to suggest appendicitis, and there are no GU symptoms to suggest PID or other pelvic etiology.  She states she has an appointment with GI on Monday for recheck.  Will switch analgesia to MSIR for short course of pain relief.  Dietary recommendations discussed.  Strict return precautions given.  All questions answered.    Patient discharged in stable condition.  Final Clinical Impressions(s) / ED Diagnoses   Final diagnoses:  Epigastric pain  Vomiting in adult    ED Discharge Orders        Ordered    morphine (MSIR) 15 MG tablet  Every 6 hours PRN     01/11/18 2254    ranitidine (ZANTAC) 150 MG tablet  2 times daily     01/11/18 2254       Cecille Po, MD 01/12/18 Philippa Chester    Jacalyn Lefevre, MD 01/12/18 [phone]

## 2018-01-31 ENCOUNTER — Other Ambulatory Visit: Payer: Self-pay | Admitting: Gastroenterology

## 2018-01-31 DIAGNOSIS — K8591 Acute pancreatitis with uninfected necrosis, unspecified: Secondary | ICD-10-CM

## 2018-02-23 ENCOUNTER — Encounter: Payer: Self-pay | Admitting: Adult Health

## 2018-02-23 ENCOUNTER — Ambulatory Visit (INDEPENDENT_AMBULATORY_CARE_PROVIDER_SITE_OTHER): Payer: Managed Care, Other (non HMO) | Admitting: Adult Health

## 2018-02-23 ENCOUNTER — Other Ambulatory Visit: Payer: Self-pay

## 2018-02-23 VITALS — BP 103/64 | HR 64 | Resp 18 | Ht 65.0 in | Wt 221.0 lb

## 2018-02-23 DIAGNOSIS — Z975 Presence of (intrauterine) contraceptive device: Secondary | ICD-10-CM | POA: Diagnosis not present

## 2018-02-23 DIAGNOSIS — Z01419 Encounter for gynecological examination (general) (routine) without abnormal findings: Secondary | ICD-10-CM | POA: Diagnosis not present

## 2018-02-23 DIAGNOSIS — Z113 Encounter for screening for infections with a predominantly sexual mode of transmission: Secondary | ICD-10-CM | POA: Diagnosis not present

## 2018-02-23 DIAGNOSIS — Z8719 Personal history of other diseases of the digestive system: Secondary | ICD-10-CM | POA: Diagnosis not present

## 2018-02-23 NOTE — Progress Notes (Signed)
Patient ID: Erika Lyons, female   DOB: 03-04-1991, 27 y.o.   MRN: [id number] History of Present Illness: Erika Lyons is a 27 year old black female, single, G0P0, in for a well woman gyn exam, she had a normal pap 03/28/16. She had pancreatitis in July and has F/U MRI 03/02/18.   Current Medications, Allergies, Past Medical History, Past Surgical History, Family History and Social History were reviewed in [employer] record.     Review of Systems: Patient denies any headaches, hearing loss, fatigue, blurred vision, shortness of breath, chest pain, abdominal pain, problems with bowel movements, urination, or intercourse(has female partner). No joint pain or mood swings.Periods irregualr with nexplanon.  She is not drinking.Has noticed that if eats fatty foods, has some pain, instructed not to eat fatty, fired foods.   Physical Exam:BP 103/64 (BP Location: Right Arm, Patient Position: Sitting, Cuff Size: Normal)   Pulse 64   Resp 18   Ht 5\' 5"  (1.651 m)   Wt 221 lb (100.2 kg)   BMI 36.78 kg/m  General:  Well developed, well nourished, no acute distress Skin:  Warm and dry Neck:  Midline trachea, normal thyroid, good ROM, no lymphadenopathy Lungs; Clear to auscultation bilaterally Breast:  No dominant palpable mass, retraction, or nipple discharge Cardiovascular: Regular rate and rhythm Abdomen:  Soft, non tender, no hepatosplenomegaly Pelvic:  External genitalia is normal in appearance, no lesions.  The vagina is normal in appearance. Urethra has no lesions or masses. The cervix is smooth, GC/CHL obtained.  Uterus is felt to be normal size, shape, and contour.  No adnexal masses or tenderness noted.Bladder is non tender, no masses felt. Extremities/musculoskeletal:  No swelling or varicosities noted, no clubbing or cyanosis Psych:  No mood changes, alert and cooperative,seems happy PHQ 2 score 0. Examination chaperoned by Francene Finders RN.  Impression:  1. Encounter  for well woman exam with routine gynecological exam   2. Nexplanon in place   3. Screening examination for STD (sexually transmitted disease)   4. History of pancreatitis      Plan: GC/CHL sent  Check HIV,RPR,CBC and CMP Pap and physical in 1 year

## 2018-02-24 LAB — COMPREHENSIVE METABOLIC PANEL
ALT: 23 IU/L (ref 0–32)
AST: 26 IU/L (ref 0–40)
Albumin/Globulin Ratio: 1.6 (ref 1.2–2.2)
Albumin: 4.6 g/dL (ref 3.5–5.5)
Alkaline Phosphatase: 80 IU/L (ref 39–117)
BILIRUBIN TOTAL: 0.8 mg/dL (ref 0.0–1.2)
BUN/Creatinine Ratio: 11 (ref 9–23)
BUN: 9 mg/dL (ref 6–20)
CALCIUM: 9.4 mg/dL (ref 8.7–10.2)
CHLORIDE: 104 mmol/L (ref 96–106)
CO2: 23 mmol/L (ref 20–29)
Creatinine, Ser: 0.83 mg/dL (ref 0.57–1.00)
GFR calc non Af Amer: 98 mL/min/{1.73_m2} (ref >59)
GFR, EST AFRICAN AMERICAN: 113 mL/min/{1.73_m2} (ref >59)
GLUCOSE: 84 mg/dL (ref 65–99)
Globulin, Total: 2.9 g/dL (ref 1.5–4.5)
Potassium: 4.4 mmol/L (ref 3.5–5.2)
Sodium: 140 mmol/L (ref 134–144)
TOTAL PROTEIN: 7.5 g/dL (ref 6.0–8.5)

## 2018-02-24 LAB — CBC
Hematocrit: 36.2 % (ref 34.0–46.6)
Hemoglobin: 11.5 g/dL (ref 11.1–15.9)
MCH: 27.4 pg (ref 26.6–33.0)
MCHC: 31.8 g/dL (ref 31.5–35.7)
MCV: 86 fL (ref 79–97)
PLATELETS: 320 10*3/uL (ref 150–450)
RBC: 4.2 x10E6/uL (ref 3.77–5.28)
RDW: 14.4 % (ref 12.3–15.4)
WBC: 10.4 10*3/uL (ref 3.4–10.8)

## 2018-02-24 LAB — RPR: RPR Ser Ql: NONREACTIVE

## 2018-02-24 LAB — HIV ANTIBODY (ROUTINE TESTING W REFLEX): HIV SCREEN 4TH GENERATION: NONREACTIVE

## 2018-02-25 LAB — GC/CHLAMYDIA PROBE AMP
Chlamydia trachomatis, NAA: NEGATIVE
NEISSERIA GONORRHOEAE BY PCR: NEGATIVE

## 2018-02-28 ENCOUNTER — Telehealth: Payer: Self-pay | Admitting: Adult Health

## 2018-02-28 NOTE — Telephone Encounter (Signed)
Pt aware that labs normal  

## 2018-02-28 NOTE — Telephone Encounter (Signed)
Pt needs Victorino Dike to give her a call, was here last week and was told to call her today

## 2018-03-02 ENCOUNTER — Ambulatory Visit
Admission: RE | Admit: 2018-03-02 | Discharge: 2018-03-02 | Disposition: A | Discharge status: Home / Self Care | Payer: Managed Care, Other (non HMO) | Source: Ambulatory Visit | Attending: Gastroenterology | Admitting: Gastroenterology

## 2018-03-02 DIAGNOSIS — K8591 Acute pancreatitis with uninfected necrosis, unspecified: Secondary | ICD-10-CM

## 2018-03-02 MED ORDER — GADOBENATE DIMEGLUMINE 529 MG/ML IV SOLN
20.0000 mL | Freq: Once | INTRAVENOUS | Status: AC | PRN
  Administered 2018-03-02: 20 mL via INTRAVENOUS

## 2019-05-27 ENCOUNTER — Ambulatory Visit (INDEPENDENT_AMBULATORY_CARE_PROVIDER_SITE_OTHER): Payer: Managed Care, Other (non HMO) | Admitting: Obstetrics & Gynecology

## 2019-05-27 ENCOUNTER — Other Ambulatory Visit: Payer: Self-pay

## 2019-05-27 ENCOUNTER — Encounter: Payer: Self-pay | Admitting: Obstetrics & Gynecology

## 2019-05-27 ENCOUNTER — Other Ambulatory Visit (HOSPITAL_COMMUNITY)
Admission: RE | Admit: 2019-05-27 | Discharge: 2019-05-27 | Disposition: A | Discharge status: Home / Self Care | Payer: Managed Care, Other (non HMO) | Source: Ambulatory Visit | Attending: Obstetrics & Gynecology | Admitting: Obstetrics & Gynecology

## 2019-05-27 VITALS — BP 98/65 | HR 66 | Ht 65.0 in | Wt 225.0 lb

## 2019-05-27 DIAGNOSIS — Z01419 Encounter for gynecological examination (general) (routine) without abnormal findings: Secondary | ICD-10-CM

## 2019-05-27 NOTE — Progress Notes (Signed)
Subjective:     Erika Lyons is a 28 y.o. female here for a routine exam.  Patient's last menstrual period was 05/16/2019. G0P0 Birth Control Method:  nexplanon Menstrual Calendar(currently): usually 6-7 days not heavy  Current complaints: none.   Current acute medical issues:     Recent Gynecologic History Patient's last menstrual period was 05/16/2019. Last Pap: 2017,  normal Last mammogram: ,    Past Medical History:  Diagnosis Date  . Asthma   . Depression   . Eczema   . Headache above the eye region 01/18/2016  . Menstrual extraction 01/22/2014  . Nexplanon in place 03/09/2015  . Nexplanon insertion 07/01/2014   Inserted left arm 07/01/14 remove 07/01/17  . Pancreatitis 12/2017  . Vitamin D deficiency 01/19/2016    No past surgical history on file.  OB History    Gravida  0   Para  0   Term      Preterm      AB      Living        SAB      TAB      Ectopic      Multiple      Live Births              Social History   Socioeconomic History  . Marital status: Single    Spouse name: Not on file  . Number of children: Not on file  . Years of education: Not on file  . Highest education level: Not on file  Occupational History  . Not on file  Social Needs  . Financial resource strain: Not on file  . Food insecurity    Worry: Not on file    Inability: Not on file  . Transportation needs    Medical: Not on file    Non-medical: Not on file  Tobacco Use  . Smoking status: Former Smoker    Years: 1.00    Types: Cigarettes  . Smokeless tobacco: Never Used  Substance and Sexual Activity  . Alcohol use: Yes    Comment: occ.  . Drug use: No  . Sexual activity: Yes    Birth control/protection: Implant  Lifestyle  . Physical activity    Days per week: Not on file    Minutes per session: Not on file  . Stress: Not on file  Relationships  . Social Musician on phone: Not on file    Gets together: Not on file    Attends religious  service: Not on file    Active member of club or organization: Not on file    Attends meetings of clubs or organizations: Not on file    Relationship status: Not on file  Other Topics Concern  . Not on file  Social History Narrative  . Not on file    Family History  Problem Relation Age of Onset  . Heart disease Maternal Grandfather   . Cancer Paternal Grandmother        breast   . Hypertension Maternal Grandmother   . Leukemia Maternal Grandmother   . Hypertension Mother      Current Outpatient Medications:  .  butalbital-acetaminophen-caffeine (FIORICET, ESGIC) 50-325-40 MG tablet, Take 1 tablet by mouth every 6 (six) hours as needed for headache. (Patient not taking: Reported on 05/27/2019), Disp: 30 tablet, Rfl: 1 .  Etonogestrel (NEXPLANON [place]), Inject 1 application into the skin once. , Disp: , Rfl:  .  ranitidine (ZANTAC) 150 MG  tablet, Take 1 tablet (150 mg total) by mouth 2 (two) times daily for 10 days., Disp: 20 tablet, Rfl: 0  Review of Systems  Review of Systems  Constitutional: Negative for fever, chills, weight loss, malaise/fatigue and diaphoresis.  HENT: Negative for hearing loss, ear pain, nosebleeds, congestion, sore throat, neck pain, tinnitus and ear discharge.   Eyes: Negative for blurred vision, double vision, photophobia, pain, discharge and redness.  Respiratory: Negative for cough, hemoptysis, sputum production, shortness of breath, wheezing and stridor.   Cardiovascular: Negative for chest pain, palpitations, orthopnea, claudication, leg swelling and PND.  Gastrointestinal: negative for abdominal pain. Negative for heartburn, nausea, vomiting, diarrhea, constipation, blood in stool and melena.  Genitourinary: Negative for dysuria, urgency, frequency, hematuria and flank pain.  Musculoskeletal: Negative for myalgias, back pain, joint pain and falls.  Skin: Negative for itching and rash.  Neurological: Negative for dizziness, tingling, tremors, sensory  change, speech change, focal weakness, seizures, loss of consciousness, weakness and headaches.  Endo/Heme/Allergies: Negative for environmental allergies and polydipsia. Does not bruise/bleed easily.  Psychiatric/Behavioral: Negative for depression, suicidal ideas, hallucinations, memory loss and substance abuse. The patient is not nervous/anxious and does not have insomnia.        Objective:  Blood pressure 98/65, pulse 66, height 5\' 5"  (1.651 m), weight 225 lb (102.1 kg), last menstrual period 05/16/2019.   Physical Exam  Vitals reviewed. Constitutional: She is oriented to person, place, and time. She appears well-developed and well-nourished.  HENT:  Head: Normocephalic and atraumatic.        Right Ear: External ear normal.  Left Ear: External ear normal.  Nose: Nose normal.  Mouth/Throat: Oropharynx is clear and moist.  Eyes: Conjunctivae and EOM are normal. Pupils are equal, round, and reactive to light. Right eye exhibits no discharge. Left eye exhibits no discharge. No scleral icterus.  Neck: Normal range of motion. Neck supple. No tracheal deviation present. No thyromegaly present.  Cardiovascular: Normal rate, regular rhythm, normal heart sounds and intact distal pulses.  Exam reveals no gallop and no friction rub.   No murmur heard. Respiratory: Effort normal and breath sounds normal. No respiratory distress. She has no wheezes. She has no rales. She exhibits no tenderness.  GI: Soft. Bowel sounds are normal. She exhibits no distension and no mass. There is no tenderness. There is no rebound and no guarding.  Genitourinary:  Breasts no masses skin changes or nipple changes bilaterally      Vulva is normal without lesions Vagina is pink moist without discharge Cervix normal in appearance and pap is done Uterus is normal size shape and contour Adnexa is negative with normal sized ovaries   Musculoskeletal: Normal range of motion. She exhibits no edema and no tenderness.   Neurological: She is alert and oriented to person, place, and time. She has normal reflexes. She displays normal reflexes. No cranial nerve deficit. She exhibits normal muscle tone. Coordination normal.  Skin: Skin is warm and dry. No rash noted. No erythema. No pallor.  Psychiatric: She has a normal mood and affect. Her behavior is normal. Judgment and thought content normal.       Medications Ordered at today's visit: No orders of the defined types were placed in this encounter.   Other orders placed at today's visit: No orders of the defined types were placed in this encounter.     Assessment:    Healthy female exam.    Plan:    Contraception: Nexplanon. Follow up in: 3 years.  or as needed   No follow-ups on file.

## 2019-05-29 LAB — CYTOLOGY - PAP
Chlamydia: NEGATIVE
Comment: NEGATIVE
Comment: NEGATIVE
Comment: NORMAL
High risk HPV: NEGATIVE
Neisseria Gonorrhea: NEGATIVE

## 2019-06-15 IMAGING — MR MR ABDOMEN WO/W CM MRCP
11 of 16 series · 29 of 48 positions shown · IV contrast (20ml Multihance)
Comparison: CT 01/09/2018

CLINICAL DATA: Follow-up of pancreatitis. Left upper quadrant pain.
Nausea and vomiting.

EXAM:
MRI ABDOMEN WITHOUT AND WITH CONTRAST (INCLUDING MRCP)
TECHNIQUE: Multiplanar multisequence MR imaging of the abdomen was performed
both before and after the administration of intravenous contrast.
Heavily T2-weighted images of the biliary and pancreatic ducts were
obtained, and three-dimensional MRCP images were rendered by post
processing.
CONTRAST:  20mL MULTIHANCE GADOBENATE DIMEGLUMINE 529 MG/ML IV SOLN

[Series 3: T2 · coronal · 5.0mm · 1.56mm/px · 1 of 38 slices shown (1 of 3)]
[im 1/38]
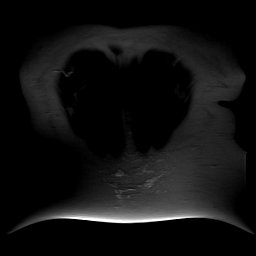

[Series 4: T2 · axial · 5.0mm · 1.41mm/px · 1 of 40 slices shown (2 of 3)]
[im 1/40]
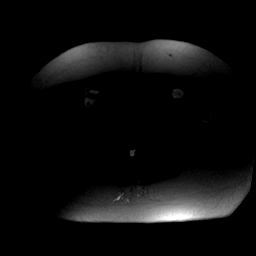

[Series 5: axial in out · axial · 6.0mm · 0.74mm/px · z∈[-41,+205]mm · 2 of 72 slices shown]
[im 1/72]
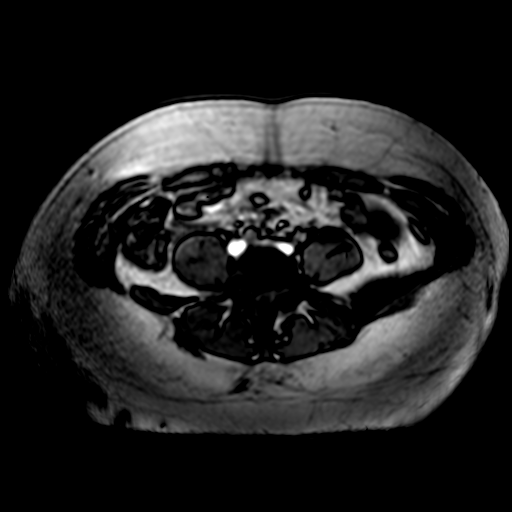
[im 72/72]
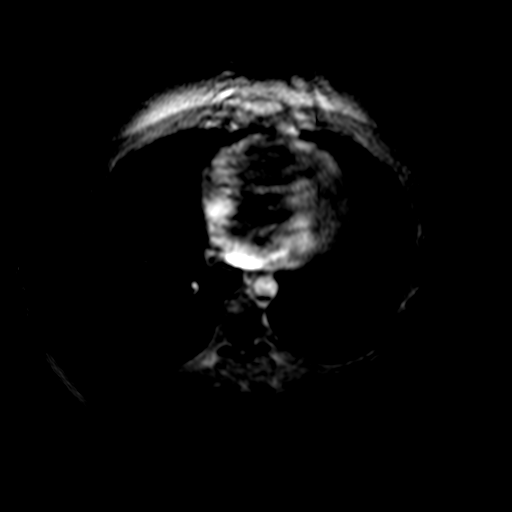

[Series 6: T2 · axial · 6.0mm · 0.74mm/px · z∈[-11,+270]mm · 2 of 40 slices shown (3 of 3)]
[im 1/40]
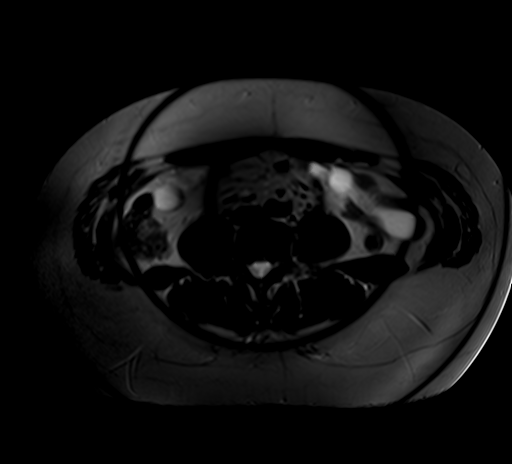
[im 40/40]
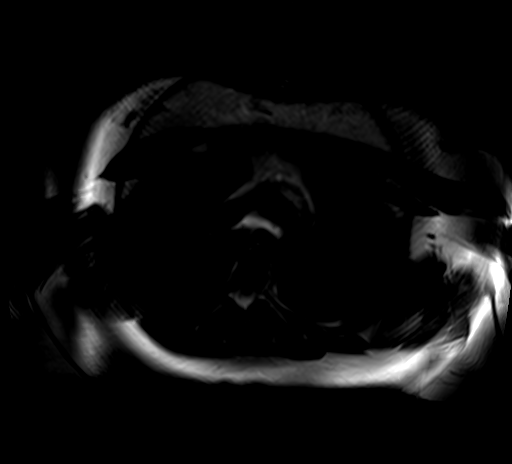

[Series 9: MRCP fat-sat · coronal · 3.0mm · 0.70mm/px · 2 of 51 slices shown]
[im 1/51]
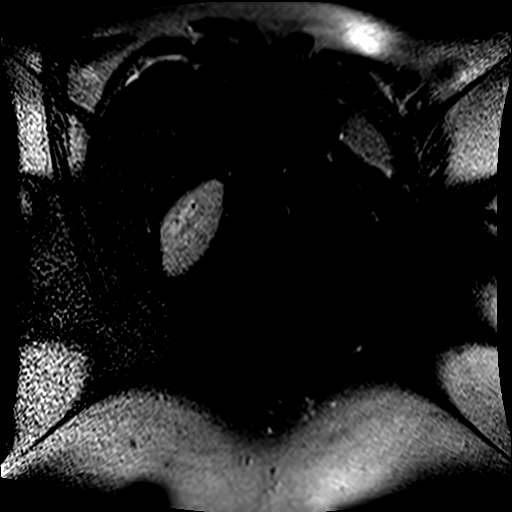
[im 51/51]
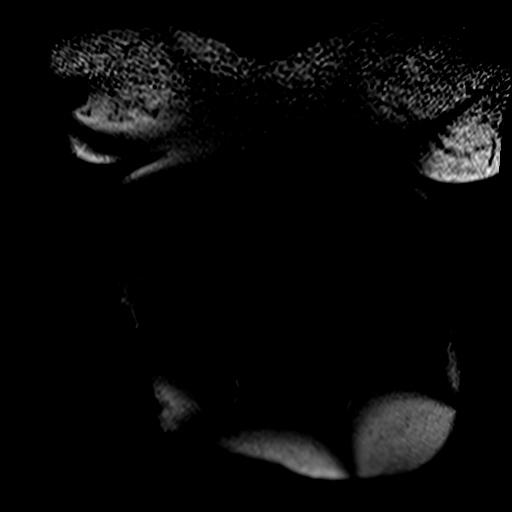

[Series 12: ep2d_diff_b50_500_800_p2_trig · axial · 6.0mm · 1.98mm/px · z∈[-11,+270]mm · 6 of 120 slices shown]
[im 1/120]
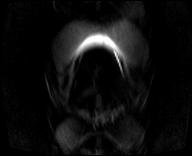
[im 24/120]
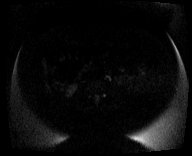
[im 48/120]
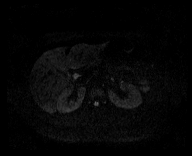
[im 72/120]
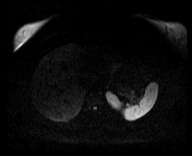
[im 96/120]
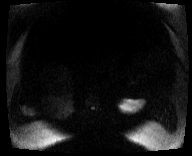
[im 120/120]
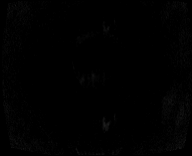

[Series 13: ep2d_diff_b50_500_800_p2_trig_adc · axial · 6.0mm · 1.98mm/px · z∈[-11,+270]mm · 2 of 40 slices shown]
[im 1/40]
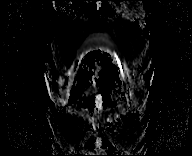
[im 40/40]
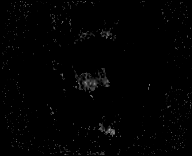

[Series 14: T1 dynamic · axial · non-contrast · 3.0mm · 0.78mm/px · z∈[-33,+204]mm · 4 of 80 slices shown]
[im 1/80]
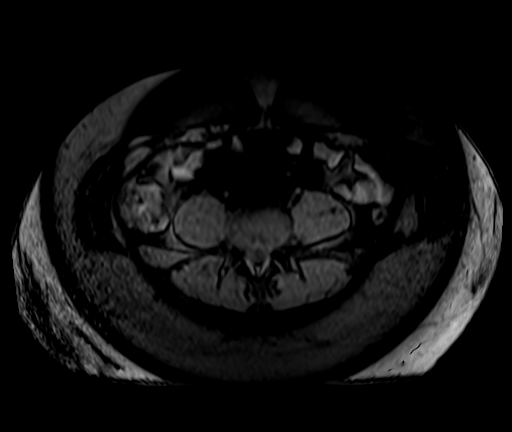
[im 27/80]
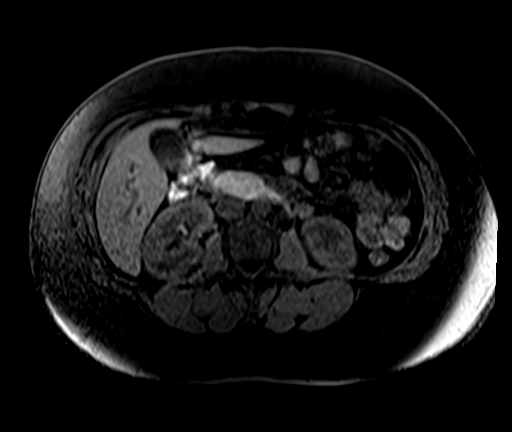
[im 53/80]
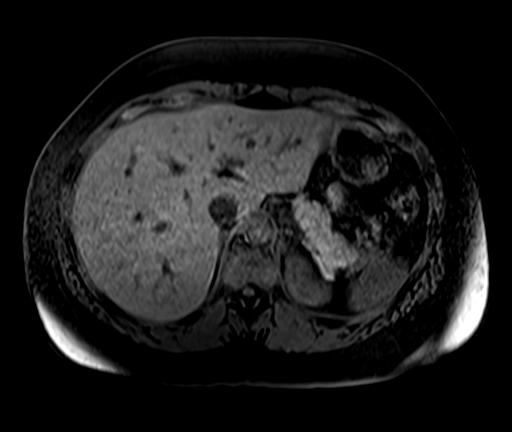
[im 80/80]
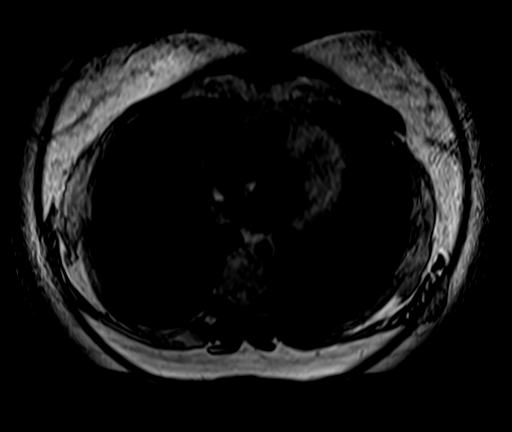

[Series 15: post 25 sec · axial · 3.0mm · 0.78mm/px · z∈[-33,+204]mm · 4 of 80 slices shown]
[im 1/80]
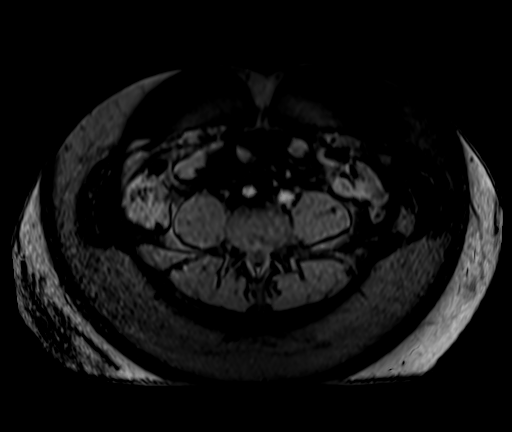
[im 27/80]
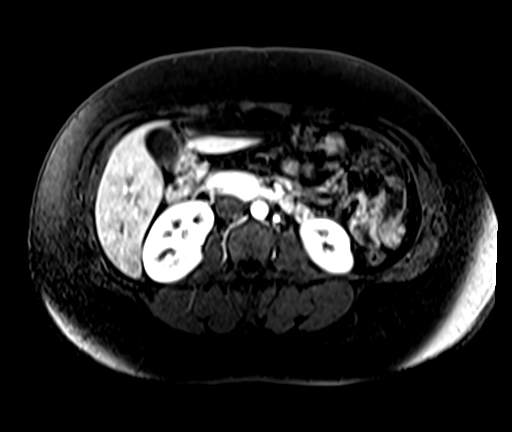
[im 53/80]
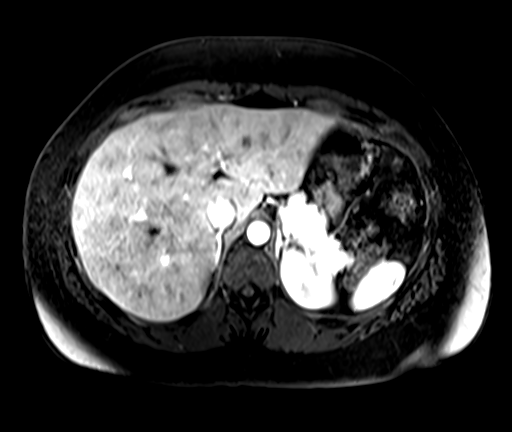
[im 80/80]
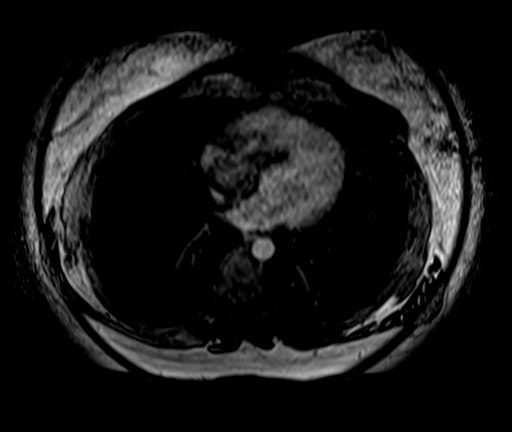

[Series 16: post 25 sec_sub · axial · 3.0mm · 0.78mm/px · z∈[-33,+204]mm · 4 of 80 slices shown]
[im 1/80]
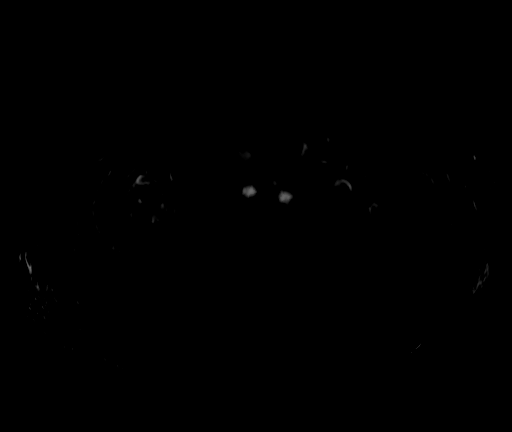
[im 27/80]
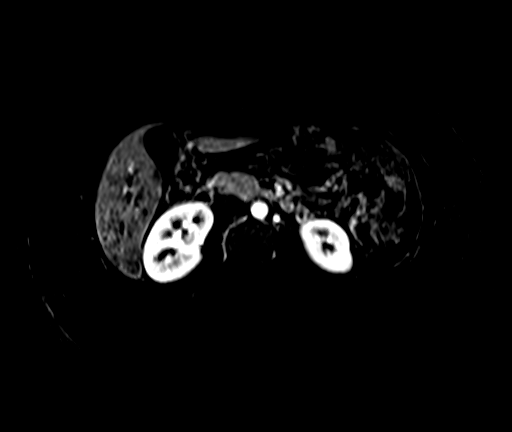
[im 53/80]
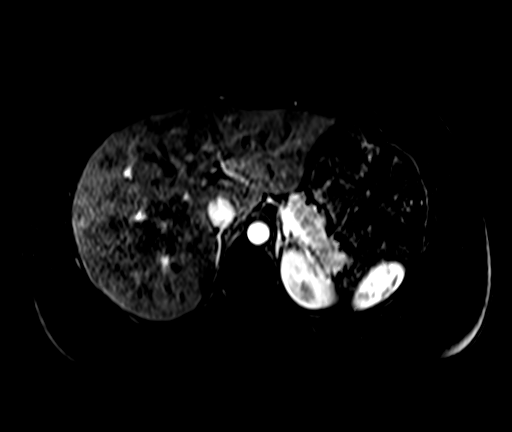
[im 80/80]
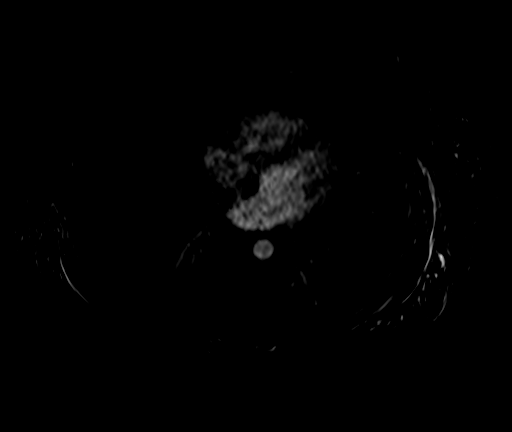

[Series 17: post 45 sec · axial · 3.0mm · 0.78mm/px · 1 of 80 slices shown]
[im 1/80]
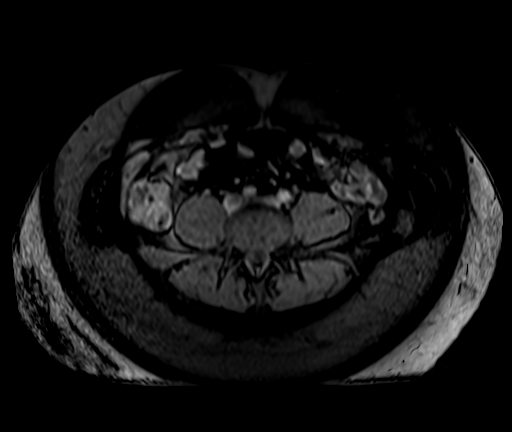

[29 of 48 positions shown; findings below may reference images not displayed]

FINDINGS: Lower chest: Left hemidiaphragm elevation. Normal heart size without
pericardial or pleural effusion.

Hepatobiliary: Tiny right hepatic lobe T2 hyperintense lesion is
likely a cyst. No suspicious liver lesion. Normal gallbladder,
without biliary ductal dilatation.

Pancreas: No pancreatic duct dilatation. No residual peripancreatic
fluid collection or phlegmon. No pancreatic necrosis. No pancreas
divisum.

Spleen:  Normal in size, without focal abnormality.

Adrenals/Urinary Tract: Normal adrenal glands. Normal kidneys,
without hydronephrosis.

Stomach/Bowel: Normal stomach and abdominal bowel loops.

Vascular/Lymphatic: Normal caliber of the aorta and branch vessels.
Patent portal and splenic veins. No retroperitoneal or retrocrural
adenopathy.

Other:  No ascites.

Musculoskeletal: No acute osseous abnormality.
IMPRESSION: 1. No evidence of residual pancreatitis, peripancreatic fluid
collection/phlegmon, or evidence of pancreatic necrosis.
2.  No acute abdominal process.

## 2019-11-23 ENCOUNTER — Other Ambulatory Visit: Payer: Self-pay

## 2019-11-23 ENCOUNTER — Emergency Department (HOSPITAL_COMMUNITY)
Admission: EM | Admit: 2019-11-23 | Discharge: 2019-11-23 | Disposition: A | Discharge status: Home / Self Care | Payer: Managed Care, Other (non HMO) | Attending: Emergency Medicine | Admitting: Emergency Medicine

## 2019-11-23 ENCOUNTER — Encounter (HOSPITAL_COMMUNITY): Payer: Self-pay | Admitting: Emergency Medicine

## 2019-11-23 DIAGNOSIS — R112 Nausea with vomiting, unspecified: Secondary | ICD-10-CM | POA: Insufficient documentation

## 2019-11-23 DIAGNOSIS — R1012 Left upper quadrant pain: Secondary | ICD-10-CM

## 2019-11-23 DIAGNOSIS — Z87891 Personal history of nicotine dependence: Secondary | ICD-10-CM | POA: Insufficient documentation

## 2019-11-23 DIAGNOSIS — R1013 Epigastric pain: Secondary | ICD-10-CM | POA: Insufficient documentation

## 2019-11-23 LAB — URINALYSIS, ROUTINE W REFLEX MICROSCOPIC
Bilirubin Urine: NEGATIVE
Glucose, UA: NEGATIVE mg/dL
Hgb urine dipstick: NEGATIVE
Ketones, ur: NEGATIVE mg/dL
Leukocytes,Ua: NEGATIVE
Nitrite: NEGATIVE
Protein, ur: NEGATIVE mg/dL
Specific Gravity, Urine: 1.019 (ref 1.005–1.030)
pH: 6 (ref 5.0–8.0)

## 2019-11-23 LAB — COMPREHENSIVE METABOLIC PANEL
ALT: 22 U/L (ref 0–44)
AST: 21 U/L (ref 15–41)
Albumin: 3.9 g/dL (ref 3.5–5.0)
Alkaline Phosphatase: 65 U/L (ref 38–126)
Anion gap: 8 (ref 5–15)
BUN: 8 mg/dL (ref 6–20)
CO2: 25 mmol/L (ref 22–32)
Calcium: 9.1 mg/dL (ref 8.9–10.3)
Chloride: 103 mmol/L (ref 98–111)
Creatinine, Ser: 0.77 mg/dL (ref 0.44–1.00)
GFR calc Af Amer: >60 mL/min (ref >60)
GFR calc non Af Amer: >60 mL/min (ref >60)
Glucose, Bld: 94 mg/dL (ref 70–99)
Potassium: 3.7 mmol/L (ref 3.5–5.1)
Sodium: 136 mmol/L (ref 135–145)
Total Bilirubin: 1.4 mg/dL — ABNORMAL HIGH (ref 0.3–1.2)
Total Protein: 8.1 g/dL (ref 6.5–8.1)

## 2019-11-23 LAB — CBC
HCT: 40.9 % (ref 36.0–46.0)
Hemoglobin: 12.7 g/dL (ref 12.0–15.0)
MCH: 27.9 pg (ref 26.0–34.0)
MCHC: 31.1 g/dL (ref 30.0–36.0)
MCV: 89.9 fL (ref 80.0–100.0)
Platelets: 353 10*3/uL (ref 150–400)
RBC: 4.55 MIL/uL (ref 3.87–5.11)
RDW: 12.9 % (ref 11.5–15.5)
WBC: 11.6 10*3/uL — ABNORMAL HIGH (ref 4.0–10.5)
nRBC: 0 % (ref 0.0–0.2)

## 2019-11-23 LAB — LIPASE, BLOOD: Lipase: 31 U/L (ref 11–51)

## 2019-11-23 LAB — I-STAT BETA HCG BLOOD, ED (MC, WL, AP ONLY): I-stat hCG, quantitative: <5 m[IU]/mL (ref <5)

## 2019-11-23 MED ORDER — LIDOCAINE VISCOUS HCL 2 % MT SOLN
15.0000 mL | Freq: Once | OROMUCOSAL | Status: AC
  Administered 2019-11-23: 15 mL via ORAL
  Filled 2019-11-23: qty 15

## 2019-11-23 MED ORDER — OMEPRAZOLE 40 MG PO CPDR: 40.0000 mg | DELAYED_RELEASE_CAPSULE | Freq: Every morning | ORAL | 0 refills | Status: DC

## 2019-11-23 MED ORDER — ONDANSETRON 4 MG PO TBDP
4.0000 mg | ORAL_TABLET | Freq: Three times a day (TID) | ORAL | 0 refills | Status: DC | PRN
Start: 2019-11-23 — End: 2020-04-10

## 2019-11-23 MED ORDER — MORPHINE SULFATE (PF) 4 MG/ML IV SOLN
4.0000 mg | Freq: Once | INTRAVENOUS | Status: AC
  Administered 2019-11-23: 4 mg via INTRAVENOUS
  Filled 2019-11-23: qty 1

## 2019-11-23 MED ORDER — SODIUM CHLORIDE 0.9% FLUSH: 3.0000 mL | Freq: Once | INTRAVENOUS | Status: DC

## 2019-11-23 MED ORDER — ALUM & MAG HYDROXIDE-SIMETH 200-200-20 MG/5ML PO SUSP
30.0000 mL | Freq: Once | ORAL | Status: AC
  Administered 2019-11-23: 30 mL via ORAL
  Filled 2019-11-23: qty 30

## 2019-11-23 MED ORDER — SUCRALFATE 1 GM/10ML PO SUSP: 1.0000 g | Freq: Three times a day (TID) | ORAL | 0 refills | Status: DC

## 2019-11-23 MED ORDER — SODIUM CHLORIDE 0.9 % IV BOLUS: 1000.0000 mL | Freq: Once | INTRAVENOUS | Status: DC

## 2019-11-23 MED ORDER — SODIUM CHLORIDE 0.9 % IV BOLUS
500.0000 mL | Freq: Once | INTRAVENOUS | Status: AC
  Administered 2019-11-23: 500 mL via INTRAVENOUS

## 2019-11-23 NOTE — ED Triage Notes (Signed)
C/o LLQ pain x 2 days with vomiting.  Denies nausea, diarrhea, or urinary complaints.

## 2019-11-23 NOTE — Discharge Instructions (Addendum)
You were seen in the ER for nausea, vomiting, abdominal pain. Labs were normal.   I suspect your symptoms may be from gastritis, acid reflux or possibly ulcer.  Could have also been a virus. All of these are managed similarly.   We will treat this with anti-acid medicines.  Start taking over the counter omeprazole to 40 mg daily, take on an empty stomach first thing in the morning and wait 20-30 min before eating.  Use sucralfate solution 20 min before meals and at bed time. Use ondansetron 4 mg every 8 hours for nausea as needed  Avoid irritating foods and liquids such as alcohol, greasy/fatty or acidic foods. Avoid ibuprofen containing products.   Follow up with primary care doctor for further management of this if it persits.   Return to the ER for fever, chills, blood in vomit or stool, worsening localized abdominal pain to right upper or right lower abdomen, inability to tolerate fluids.

## 2019-11-23 NOTE — ED Provider Notes (Signed)
[facility] EMERGENCY DEPARTMENT Provider Note   CSN: [id number] Arrival date & time: 11/23/19  9323     History Chief Complaint  Patient presents with  . Abdominal Pain    Erika Lyons is a 29 y.o. female with history of pancreatitis presents to the ER for evaluation of abdominal pain.  Onset 2 days ago.  Located to the left upper quadrant and epigastrium with some radiation to the right upper abdomen.  It feels like a sharp crampy type pain that is constant but intermittently worsens.  It is worse if she walks or stands up too long or depending on how she lays.  The pain began the evening after she went out with her friends to have food and out drinking.  Denies significant alcohol intake.  Initially thought it was from constipation so she took MiraLAX 2 days ago.  Her mother recommended she take Pepto-Bismol which she took twice.  No relief.  Associated with nausea and vomiting after she took MiraLAX, emesis was clear.  She had a small bowel movement yesterday.  She has history of acid reflux and takes Tums as needed but states she does not feel like her acid reflux is necessarily any worse.  The pain is an 8/10.  No nausea anymore.  States her stomach feels like it is growling from hunger now.  Denies fever.  No hematemesis or coffee-ground emesis.  No diarrhea.  No blood in her stool.  No dysuria, hematuria or frequency.  No abdominal surgeries.  No history kidney stones.  No sick contacts.  No Covid vaccine.  Denies myalgias, new congestion, sore throat, cough. HPI     Past Medical History:  Diagnosis Date  . Asthma   . Depression   . Eczema   . Headache above the eye region 01/18/2016  . Menstrual extraction 01/22/2014  . Nexplanon in place 03/09/2015  . Nexplanon insertion 07/01/2014   Inserted left arm 07/01/14 remove 07/01/17  . Pancreatitis 12/2017  . Vitamin D deficiency 01/19/2016    Patient Active Problem List   Diagnosis Date Noted  . Screening  examination for STD (sexually transmitted disease) 02/23/2018  . Encounter for well woman exam with routine gynecological exam 02/23/2018  . History of pancreatitis 02/23/2018  . Vitamin D deficiency 01/19/2016  . Headache above the eye region 01/18/2016  . Nexplanon in place 03/09/2015  . Nexplanon insertion 07/01/2014  . Major depression, single episode 03/21/2014  . Overdose 03/21/2014  . Menstrual extraction 01/22/2014  . Asthma 10/15/2012  . Eczema 10/15/2012    History reviewed. No pertinent surgical history.   OB History    Gravida  0   Para  0   Term      Preterm      AB      Living        SAB      TAB      Ectopic      Multiple      Live Births              Family History  Problem Relation Age of Onset  . Heart disease Maternal Grandfather   . Cancer Paternal Grandmother        breast   . Hypertension Maternal Grandmother   . Leukemia Maternal Grandmother   . Hypertension Mother     Social History   Tobacco Use  . Smoking status: Former Smoker    Years: 1.00    Types: Cigarettes  .  Smokeless tobacco: Never Used  Substance Use Topics  . Alcohol use: Yes    Comment: occ.  . Drug use: No    Home Medications Prior to Admission medications   Medication Sig Start Date End Date Taking? Authorizing Provider  butalbital-acetaminophen-caffeine (FIORICET, ESGIC) 50-325-40 MG tablet Take 1 tablet by mouth every 6 (six) hours as needed for headache. Patient not taking: Reported on 05/27/2019 01/18/16   Adline Potter, NP  Etonogestrel (NEXPLANON [place]) Inject 1 application into the skin once.     [provider]  omeprazole (PRILOSEC) 40 MG capsule Take 1 capsule (40 mg total) by mouth in the morning. 11/23/19 12/23/19  Liberty Handy, PA-C  ondansetron (ZOFRAN ODT) 4 MG disintegrating tablet Take 1 tablet (4 mg total) by mouth every 8 (eight) hours as needed for nausea or vomiting. 11/23/19   Liberty Handy, PA-C  ranitidine (ZANTAC)  150 MG tablet Take 1 tablet (150 mg total) by mouth 2 (two) times daily for 10 days. 01/11/18 01/21/18  Cecille Po, MD  sucralfate (CARAFATE) 1 GM/10ML suspension Take 10 mLs (1 g total) by mouth 4 (four) times daily -  with meals and at bedtime. 11/23/19   Liberty Handy, PA-C    Allergies    Patient has no known allergies.  Review of Systems   Review of Systems  Gastrointestinal: Positive for abdominal pain, nausea and vomiting.  All other systems reviewed and are negative.   Physical Exam Updated Vital Signs BP 99/60   Pulse 67   Temp 98.5 F (36.9 C) (Oral)   Resp 16   Ht 5\' 4"  (1.626 m)   Wt 98 kg   SpO2 100%   BMI 37.08 kg/m   Physical Exam Vitals and nursing note reviewed.  Constitutional:      Appearance: She is well-developed.     Comments: Non toxic in NAD  HENT:     Head: Normocephalic and atraumatic.     Nose: Nose normal.  Eyes:     Conjunctiva/sclera: Conjunctivae normal.  Cardiovascular:     Rate and Rhythm: Normal rate and regular rhythm.  Pulmonary:     Effort: Pulmonary effort is normal.     Breath sounds: Normal breath sounds.  Abdominal:     General: Bowel sounds are normal.     Palpations: Abdomen is soft.     Tenderness: There is abdominal tenderness in the epigastric area and left upper quadrant.     Comments: No G/R/R. No suprapubic or CVA tenderness. Negative Murphy's and McBurney's. Active BS to lower quadrants.   Musculoskeletal:        General: Normal range of motion.     Cervical back: Normal range of motion.  Skin:    General: Skin is warm and dry.     Capillary Refill: Capillary refill takes less than 2 seconds.  Neurological:     Mental Status: She is alert.  Psychiatric:        Behavior: Behavior normal.     ED Results / Procedures / Treatments   Labs (all labs ordered are listed, but only abnormal results are displayed) Labs Reviewed  COMPREHENSIVE METABOLIC PANEL - Abnormal; Notable for the following components:       Result Value   Total Bilirubin 1.4 (*)    All other components within normal limits  CBC - Abnormal; Notable for the following components:   WBC 11.6 (*)    All other components within normal limits  LIPASE, BLOOD  URINALYSIS, ROUTINE W REFLEX MICROSCOPIC  I-STAT BETA HCG BLOOD, ED ([facility], [facility], [facility] ONLY)    EKG None  Radiology No results found.  Procedures Procedures (including critical care time)  Medications Ordered in ED Medications  sodium chloride flush (NS) 0.9 % injection 3 mL (3 mLs Intravenous Not Given 11/23/19 1859)  morphine 4 MG/ML injection 4 mg (4 mg Intravenous Given 11/23/19 1859)  sodium chloride 0.9 % bolus 500 mL (0 mLs Intravenous Stopped 11/23/19 1917)  alum & mag hydroxide-simeth (MAALOX/MYLANTA) 200-200-20 MG/5ML suspension 30 mL (30 mLs Oral Given 11/23/19 1950)    And  lidocaine (XYLOCAINE) 2 % viscous mouth solution 15 mL (15 mLs Oral Given 11/23/19 1950)    ED Course  I have reviewed the triage vital signs and the nursing notes.  Pertinent labs & imaging results that were available during my care of the patient were reviewed by me and considered in my medical decision making (see chart for details).    MDM Rules/Calculators/A&P                      29 year old female with history of pancreatitis presents for left upper quadrant and epigastric abdominal pain, nausea and vomiting.  She has history of GERD.  Exam reveals very mild left upper quadrant epigastric tenderness.  No peritonitis.  No suprapubic or CVA tenderness.  Afebrile.  Reviewed patient's EMR and nursing notes to assist in MDM and obtain further history.  She has had ER evaluations for abdominal pain resulting in diagnosis of pancreatitis.  I have ordered labs, urinalysis.  I have ordered IV fluids, morphine, GI cocktail.  Will reassess.  Patient reevaluated and reports moderate improvement in her pain initially 8/10 now more of a 3-4/10.  She is tolerating fluids.  Repeat abdominal  exam reveals improvement in tenderness.  ER work-up personally reviewed and interpreted, as above.  Minimal leukocytosis.  No electrolyte abnormalities.  Lipase, LFTs, creatinine normal.  Urinalysis without signs of infection or RBCs.  Highest on differential diagnosis at this point is viral gastroenteritis, gastritis, GERD.  No red flag symptoms like fever, hematemesis, chest pain, shortness of breath, blood in stool.  No signs of severe dehydration.  She is tolerating fluids.  No recent antibiotics.  Denies chronic use of NSAIDs or EtOH.  At this point I have low suspicion for intra-abdominal or pelvic emergency like cholecystitis, pancreatitis, SBO, diverticulitis.  She has no flank tenderness, urinary symptoms, history of kidney stones or hematuria and I do not think renal cause is likely.  We will discharge with PPI, antiemetic, oral hydration, diet changes and PCP follow-up.  Return precautions given.  He is comfortable this plan. Final Clinical Impression(s) / ED Diagnoses Final diagnoses:  Left upper quadrant abdominal pain    Rx / DC Orders ED Discharge Orders         Ordered    omeprazole (PRILOSEC) 40 MG capsule  Every morning     11/23/19 2111    sucralfate (CARAFATE) 1 GM/10ML suspension  3 times daily with meals & bedtime     11/23/19 2111    ondansetron (ZOFRAN ODT) 4 MG disintegrating tablet  Every 8 hours PRN     11/23/19 2111           Jerrell Mylar 11/23/19 2111    Pollyann Savoy, MD 11/24/19 1152

## 2020-04-06 ENCOUNTER — Ambulatory Visit: Payer: Self-pay | Admitting: Adult Health

## 2020-04-10 ENCOUNTER — Ambulatory Visit (INDEPENDENT_AMBULATORY_CARE_PROVIDER_SITE_OTHER): Payer: Self-pay | Admitting: Adult Health

## 2020-04-10 ENCOUNTER — Other Ambulatory Visit (HOSPITAL_COMMUNITY)
Admission: RE | Admit: 2020-04-10 | Discharge: 2020-04-10 | Disposition: A | Discharge status: Home / Self Care | Payer: PRIVATE HEALTH INSURANCE | Source: Ambulatory Visit | Attending: Adult Health | Admitting: Adult Health

## 2020-04-10 ENCOUNTER — Encounter: Payer: Self-pay | Admitting: Adult Health

## 2020-04-10 ENCOUNTER — Other Ambulatory Visit: Payer: Self-pay

## 2020-04-10 VITALS — BP 105/66 | HR 75 | Ht 65.0 in | Wt 232.0 lb

## 2020-04-10 DIAGNOSIS — Z975 Presence of (intrauterine) contraceptive device: Secondary | ICD-10-CM

## 2020-04-10 DIAGNOSIS — Z113 Encounter for screening for infections with a predominantly sexual mode of transmission: Secondary | ICD-10-CM | POA: Diagnosis present

## 2020-04-10 DIAGNOSIS — N898 Other specified noninflammatory disorders of vagina: Secondary | ICD-10-CM | POA: Diagnosis present

## 2020-04-10 NOTE — Progress Notes (Signed)
  Subjective:     Patient ID: Erika Lyons, female   DOB: 08-19-1990, 29 y.o.   MRN: [id number]  HPI Erika Lyons is a 29 year old black female,single, G0P0, in requesting STD testing, she had vaginal discharge a week ago but seems to have resolved. She has nexplanon and is happy with it. 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   Review of Systems Vaginal discharge a week ago Denies any itching or burning Reviewed past medical,surgical, social and family history. Reviewed medications and allergies.     Objective:   Physical Exam BP 105/66 (BP Location: Left Arm, Patient Position: Sitting, Cuff Size: Normal)   Pulse 75   Ht 5\' 5"  (1.651 m)   Wt 232 lb (105.2 kg)   BMI 38.61 kg/m  Skin warm and dry.Pelvic: external genitalia is normal in appearance no lesions, vagina: scant white discharge with slight odor,urethra has no lesions or masses noted, cervix:smooth, uterus: normal size, shape and contour, non tender, no masses felt, adnexa: no masses or tenderness noted. Bladder is non tender and no masses felt.CV swab obtained. Examination chaperoned by LPN     Upstream - 04/10/20 1028      Pregnancy Intention Screening   Does the patient want to become pregnant in the next year? No    Does the patient's partner want to become pregnant in the next year? No    Would the patient like to discuss contraceptive options today? No      Contraception Wrap Up   Current Method Hormonal Implant    End Method Hormonal Implant    Contraception Counseling Provided No          Assessment:     1. Screening examination for STD (sexually transmitted disease) CV swab sent for GC/CHl,trich, BV and yeast Check HIV,RPR and Hepatitis C antibody   2. Nexplanon in place  3. Vaginal discharge CV swab sent for BV and yeast,trich and GC/CHL    Plan:     Follow up prn

## 2020-04-11 LAB — HEPATITIS C ANTIBODY: Hep C Virus Ab: <0.1 s/co ratio (ref 0.0–0.9)

## 2020-04-11 LAB — RPR: RPR Ser Ql: NONREACTIVE

## 2020-04-11 LAB — HIV ANTIBODY (ROUTINE TESTING W REFLEX): HIV Screen 4th Generation wRfx: NONREACTIVE

## 2020-04-14 LAB — CERVICOVAGINAL ANCILLARY ONLY
Bacterial Vaginitis (gardnerella): POSITIVE — AB
Candida Glabrata: NEGATIVE
Candida Vaginitis: NEGATIVE
Chlamydia: NEGATIVE
Comment: NEGATIVE
Comment: NEGATIVE
Comment: NEGATIVE
Comment: NEGATIVE
Comment: NEGATIVE
Comment: NORMAL
Neisseria Gonorrhea: NEGATIVE
Trichomonas: NEGATIVE

## 2020-04-15 ENCOUNTER — Other Ambulatory Visit: Payer: Self-pay | Admitting: Adult Health

## 2020-04-15 MED ORDER — METRONIDAZOLE 500 MG PO TABS
500.0000 mg | ORAL_TABLET | Freq: Two times a day (BID) | ORAL | 0 refills | Status: DC
Start: 2020-04-15 — End: 2021-12-18

## 2020-04-15 NOTE — Progress Notes (Signed)
+  BV on CV swab rx flagyl  °

## 2021-01-26 ENCOUNTER — Encounter (HOSPITAL_BASED_OUTPATIENT_CLINIC_OR_DEPARTMENT_OTHER): Payer: Self-pay | Admitting: *Deleted

## 2021-01-26 ENCOUNTER — Other Ambulatory Visit: Payer: Self-pay

## 2021-01-26 ENCOUNTER — Emergency Department (HOSPITAL_BASED_OUTPATIENT_CLINIC_OR_DEPARTMENT_OTHER)
Admission: EM | Admit: 2021-01-26 | Discharge: 2021-01-26 | Disposition: A | Discharge status: Home / Self Care | Payer: No Typology Code available for payment source | Attending: Emergency Medicine | Admitting: Emergency Medicine

## 2021-01-26 ENCOUNTER — Emergency Department (HOSPITAL_BASED_OUTPATIENT_CLINIC_OR_DEPARTMENT_OTHER): Payer: No Typology Code available for payment source

## 2021-01-26 DIAGNOSIS — M791 Myalgia, unspecified site: Secondary | ICD-10-CM | POA: Diagnosis not present

## 2021-01-26 DIAGNOSIS — J45909 Unspecified asthma, uncomplicated: Secondary | ICD-10-CM | POA: Diagnosis not present

## 2021-01-26 DIAGNOSIS — Z87891 Personal history of nicotine dependence: Secondary | ICD-10-CM | POA: Diagnosis not present

## 2021-01-26 DIAGNOSIS — S34109A Unspecified injury to unspecified level of lumbar spinal cord, initial encounter: Secondary | ICD-10-CM | POA: Diagnosis present

## 2021-01-26 DIAGNOSIS — S39012A Strain of muscle, fascia and tendon of lower back, initial encounter: Secondary | ICD-10-CM

## 2021-01-26 DIAGNOSIS — Y92411 Interstate highway as the place of occurrence of the external cause: Secondary | ICD-10-CM | POA: Diagnosis not present

## 2021-01-26 DIAGNOSIS — M7918 Myalgia, other site: Secondary | ICD-10-CM

## 2021-01-26 DIAGNOSIS — M79631 Pain in right forearm: Secondary | ICD-10-CM | POA: Diagnosis not present

## 2021-01-26 LAB — PREGNANCY, URINE: Preg Test, Ur: NEGATIVE

## 2021-01-26 MED ORDER — DICLOFENAC SODIUM 50 MG PO TBEC: 50.0000 mg | DELAYED_RELEASE_TABLET | Freq: Two times a day (BID) | ORAL | 0 refills | Status: AC

## 2021-01-26 MED ORDER — METHOCARBAMOL 500 MG PO TABS
500.0000 mg | ORAL_TABLET | Freq: Two times a day (BID) | ORAL | 0 refills | Status: DC
Start: 2021-01-26 — End: 2021-12-18

## 2021-01-26 MED ORDER — NAPROXEN 250 MG PO TABS
500.0000 mg | ORAL_TABLET | Freq: Once | ORAL | Status: AC
  Administered 2021-01-26: 500 mg via ORAL
  Filled 2021-01-26: qty 2

## 2021-01-26 MED ORDER — CYCLOBENZAPRINE HCL 10 MG PO TABS
10.0000 mg | ORAL_TABLET | Freq: Once | ORAL | Status: AC
  Administered 2021-01-26: 10 mg via ORAL
  Filled 2021-01-26: qty 1

## 2021-01-26 NOTE — Discharge Instructions (Addendum)
Follow-up with your primary care provider. Warm compresses to sore muscles followed by gentle stretching.  Take Robaxin and diclofenac as needed as prescribed.
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
## 2021-03-22 ENCOUNTER — Other Ambulatory Visit: Payer: No Typology Code available for payment source

## 2021-12-18 ENCOUNTER — Encounter (HOSPITAL_COMMUNITY): Payer: Self-pay | Admitting: *Deleted

## 2021-12-18 ENCOUNTER — Other Ambulatory Visit: Payer: Self-pay

## 2021-12-18 ENCOUNTER — Emergency Department (HOSPITAL_COMMUNITY)
Admission: EM | Admit: 2021-12-18 | Discharge: 2021-12-18 | Disposition: A | Discharge status: Home / Self Care | Payer: Worker's Compensation | Attending: Emergency Medicine | Admitting: Emergency Medicine

## 2021-12-18 ENCOUNTER — Emergency Department (HOSPITAL_COMMUNITY): Payer: Worker's Compensation

## 2021-12-18 DIAGNOSIS — Q846 Other congenital malformations of nails: Secondary | ICD-10-CM | POA: Insufficient documentation

## 2021-12-18 DIAGNOSIS — M7989 Other specified soft tissue disorders: Secondary | ICD-10-CM | POA: Diagnosis present

## 2021-12-18 MED ORDER — LIDOCAINE HCL (PF) 1 % IJ SOLN
5.0000 mL | Freq: Once | INTRAMUSCULAR | Status: AC
  Administered 2021-12-18: 5 mL
  Filled 2021-12-18: qty 5

## 2021-12-18 MED ORDER — SULFAMETHOXAZOLE-TRIMETHOPRIM 800-160 MG PO TABS: 1.0000 | ORAL_TABLET | Freq: Two times a day (BID) | ORAL | 0 refills | Status: AC

## 2021-12-18 NOTE — ED Provider Notes (Signed)
[facility] EMERGENCY DEPARTMENT Provider Note   CSN: [id number] Arrival date & time: 12/18/21  1930     History  Chief Complaint  Patient presents with   Finger Injury    Erika Lyons is a 31 y.o. female.  Patient presents to the emergency department today for evaluation of right middle finger swelling.  She jammed her finger in the light of a garbage can several days ago.  Since that time, the nailbed has been more swollen and has drained some pus.  No fevers.  No other injuries.       Home Medications Prior to Admission medications   Medication Sig Start Date End Date Taking? Authorizing Provider  butalbital-acetaminophen-caffeine (FIORICET, ESGIC) [phone] MG tablet Take 1 tablet by mouth every 6 (six) hours as needed for headache. 01/18/16   Adline Potter, NP  Etonogestrel (NEXPLANON [place]) Inject 1 application into the skin once.     [provider]  methocarbamol (ROBAXIN) 500 MG tablet Take 1 tablet (500 mg total) by mouth 2 (two) times daily. 01/26/21   Jeannie Fend, PA-C  metroNIDAZOLE (FLAGYL) 500 MG tablet Take 1 tablet (500 mg total) by mouth 2 (two) times daily. 04/15/20   Adline Potter, NP      Allergies    Patient has no known allergies.    Review of Systems   Review of Systems  Physical Exam Updated Vital Signs BP 117/76 (BP Location: Right Arm)   Pulse 84   Temp 98.3 F (36.8 C) (Oral)   Resp 18   Ht 5\' 5"  (1.651 m)   Wt 99.8 kg   LMP  (LMP Unknown)   SpO2 99%   BMI 36.61 kg/m   Physical Exam Vitals and nursing note reviewed.  Constitutional:      Appearance: She is well-developed.  HENT:     Head: Normocephalic and atraumatic.  Eyes:     Pupils: Pupils are equal, round, and reactive to light.  Cardiovascular:     Pulses: Normal pulses. No decreased pulses.  Musculoskeletal:        General: Tenderness present.     Cervical back: Normal range of motion and neck supple.  Skin:    General: Skin is  warm and dry.     Comments: Right hand, middle finger: Fullness and fluctuance of the eponychial fold, no active drainage, tender to palpation.  No sign of felon.  Neurological:     Mental Status: She is alert.     Sensory: No sensory deficit.     Comments: Motor, sensation, and vascular distal to the injury is fully intact.   Psychiatric:        Mood and Affect: Mood normal.     ED Results / Procedures / Treatments   Labs (all labs ordered are listed, but only abnormal results are displayed) Labs Reviewed - No data to display  EKG None  Radiology DG Finger Middle Right  Result Date: 12/18/2021 CLINICAL DATA:  Status post trauma. EXAM: RIGHT MIDDLE FINGER 2+V COMPARISON:  None Available. FINDINGS: There is no evidence of fracture or dislocation. There is no evidence of arthropathy or other focal bone abnormality. Soft tissues are unremarkable. IMPRESSION: Negative. Electronically Signed   By: 02/18/2022 M.D.   On: 12/18/2021 20:50    Procedures .09/01/2023Incision and Drainage  Date/Time: 12/18/2021 9:57 PM  Performed by: 02/18/2022, PA-C Authorized by: Renne Crigler, PA-C   Consent:    Consent obtained:  Verbal  Consent given by:  Patient   Risks discussed:  Bleeding and incomplete drainage   Alternatives discussed:  No treatment Universal protocol:    Patient identity confirmed:  Verbally with patient, provided demographic data and arm band Location:    Indications for incision and drainage: eponychia.   Size:  0.5cm   Location:  Upper extremity   Upper extremity location:  Finger   Finger location:  R long finger Pre-procedure details:    Skin preparation:  Povidone-iodine Anesthesia:    Anesthesia method:  Local infiltration   Local anesthetic:  Lidocaine 1% w/o epi Procedure type:    Complexity:  Simple Procedure details:    Incision types:  Stab incision   Drainage:  Purulent   Drainage amount:  Scant   Wound treatment:  Wound left open   Packing  materials:  None Post-procedure details:    Procedure completion:  Tolerated well, no immediate complications     Medications Ordered in ED Medications  lidocaine (PF) (XYLOCAINE) 1 % injection 5 mL (5 mLs Infiltration Given 12/18/21 2153)    ED Course/ Medical Decision Making/ A&P    Patient seen and examined. History obtained directly from patient.   Labs/EKG: None ordered.  Imaging: X-ray of the R middle finger.   Medications/Fluids: Ordered: lidocaine 2% without epi  Most recent vital signs reviewed and are as follows: BP 117/76 (BP Location: Right Arm)   Pulse 84   Temp 98.3 F (36.8 C) (Oral)   Resp 18   Ht 5\' 5"  (1.651 m)   Wt 99.8 kg   LMP  (LMP Unknown)   SpO2 99%   BMI 36.61 kg/m   Initial impression: Eponychia  10:03 PM Reassessment performed. Patient appears stable.  Tolerated I&D well.  Personally visualized and interpreted x-ray of the finger, agree no fracture.  Most current vital signs reviewed and are as follows: BP 127/69   Pulse 65   Temp 98.3 F (36.8 C) (Oral)   Resp 16   Ht 5\' 5"  (1.651 m)   Wt 99.8 kg   LMP  (LMP Unknown)   SpO2 98%   BMI 36.61 kg/m   Plan: Discharge to home.   Prescriptions written for: Bactrim  Other home care instructions discussed: Warm soaks/compresses  ED return instructions discussed: Worsening pain, swelling, drainage  Follow-up instructions discussed: Patient encouraged to follow-up with their PCP as needed.                            Medical Decision Making Risk Prescription drug management.   Patient with eponychia after recent minor trauma.  X-ray negative.         Final Clinical Impression(s) / ED Diagnoses Final diagnoses:  Eponychia    Rx / DC Orders ED Discharge Orders     None         , PA-C 12/18/21 2205    02/18/22, MD 12/18/21 2250

## 2021-12-18 NOTE — ED Notes (Signed)
Patient verbalizes understanding of discharge instructions. Opportunity for questioning and answers were provided. Armband removed by staff, pt discharged from ED. Pt ambulatory to ED entrance.  

## 2021-12-18 NOTE — ED Triage Notes (Signed)
The pt injured her rt middle finger thrusday night when she jammed it in a trash can.  The fingernail is swollen and it has been draining and very painful  lmp lmp none

## 2021-12-19 ENCOUNTER — Telehealth: Payer: Self-pay

## 2021-12-19 NOTE — Telephone Encounter (Signed)
Pharmacy called patient ordered bactrim for 7 days, but only 10 pills were ordered. Looked through notes, script changed to 14 pills one twice a day for 7 days.

## 2021-12-19 NOTE — Care Management (Signed)
Patient called to get "workman's comp paperwork" . Explained that this comes form his place of work. He wanted to make sure workmans comp was billed for visit.

## 2022-04-14 ENCOUNTER — Other Ambulatory Visit: Payer: Self-pay | Admitting: Internal Medicine

## 2022-04-14 ENCOUNTER — Other Ambulatory Visit (HOSPITAL_BASED_OUTPATIENT_CLINIC_OR_DEPARTMENT_OTHER): Payer: Self-pay | Admitting: Internal Medicine

## 2022-04-14 ENCOUNTER — Ambulatory Visit (HOSPITAL_BASED_OUTPATIENT_CLINIC_OR_DEPARTMENT_OTHER)
Admission: RE | Admit: 2022-04-14 | Discharge: 2022-04-14 | Disposition: A | Discharge status: Home / Self Care | Payer: Medicaid Other | Source: Ambulatory Visit | Attending: Internal Medicine | Admitting: Internal Medicine

## 2022-04-14 DIAGNOSIS — R102 Pelvic and perineal pain: Secondary | ICD-10-CM | POA: Insufficient documentation

## 2022-04-14 DIAGNOSIS — N939 Abnormal uterine and vaginal bleeding, unspecified: Secondary | ICD-10-CM

## 2022-12-12 ENCOUNTER — Ambulatory Visit
Admission: RE | Admit: 2022-12-12 | Discharge: 2022-12-12 | Disposition: A | Discharge status: Home / Self Care | Payer: BC Managed Care – PPO | Source: Ambulatory Visit | Attending: Nurse Practitioner | Admitting: Nurse Practitioner

## 2022-12-12 VITALS — BP 108/64 | HR 68 | Temp 99.1°F | Resp 17

## 2022-12-12 DIAGNOSIS — N760 Acute vaginitis: Secondary | ICD-10-CM | POA: Diagnosis present

## 2022-12-12 DIAGNOSIS — R350 Frequency of micturition: Secondary | ICD-10-CM | POA: Insufficient documentation

## 2022-12-12 DIAGNOSIS — Z113 Encounter for screening for infections with a predominantly sexual mode of transmission: Secondary | ICD-10-CM | POA: Insufficient documentation

## 2022-12-12 DIAGNOSIS — N3000 Acute cystitis without hematuria: Secondary | ICD-10-CM | POA: Diagnosis not present

## 2022-12-12 LAB — POCT URINALYSIS DIP (MANUAL ENTRY)
Bilirubin, UA: NEGATIVE
Glucose, UA: NEGATIVE mg/dL
Nitrite, UA: NEGATIVE
Protein Ur, POC: NEGATIVE mg/dL
Spec Grav, UA: 1.02 (ref 1.010–1.025)
Urobilinogen, UA: 0.2 E.U./dL
pH, UA: 7 (ref 5.0–8.0)

## 2022-12-12 LAB — POCT URINE PREGNANCY: Preg Test, Ur: NEGATIVE

## 2022-12-12 MED ORDER — CEPHALEXIN 500 MG PO CAPS: 500.0000 mg | ORAL_CAPSULE | Freq: Two times a day (BID) | ORAL | 0 refills | Status: AC

## 2022-12-12 NOTE — ED Provider Notes (Signed)
[facility]    CSN: [id number] Arrival date & time: 12/12/22  1406      History   Chief Complaint Chief Complaint  Patient presents with   Exposure to STD    I believe I have a BI but I'm unsure - Entered by patient    HPI Erika Lyons is a 32 y.o. female who identifies as female presents for evaluation of dysuria/vaginal discharge.  Patient reports 2 days of a pruritic non-malodorous vaginal discharge as well as urinary frequency.  Denies hematuria, urgency, burning with urination, fevers, nausea/vomiting, flank pain.  No known STD exposure but would like screening while here.  No history of vaginal infection such as yeast or BV or UTIs in the past.  No OTC medications have been used since onset.  No other concerns at this time.   Exposure to STD    Past Medical History:  Diagnosis Date   Asthma    Depression    Eczema    Headache above the eye region 01/18/2016   Menstrual extraction 01/22/2014   Nexplanon in place 03/09/2015   Nexplanon insertion 07/01/2014   Inserted left arm 07/01/14 remove 07/01/17   Pancreatitis 12/2017   Vitamin D deficiency 01/19/2016    Patient Active Problem List   Diagnosis Date Noted   Vaginal discharge 04/10/2020   Screening examination for STD (sexually transmitted disease) 02/23/2018   Encounter for well woman exam with routine gynecological exam 02/23/2018   History of pancreatitis 02/23/2018   Vitamin D deficiency 01/19/2016   Headache above the eye region 01/18/2016   Nexplanon in place 03/09/2015   Nexplanon insertion 07/01/2014   Major depression, single episode 03/21/2014   Overdose 03/21/2014   Menstrual extraction 01/22/2014   Asthma 10/15/2012   Eczema 10/15/2012    History reviewed. No pertinent surgical history.  OB History     Gravida  0   Para  0   Term      Preterm      AB      Living         SAB      IAB      Ectopic      Multiple      Live Births               Home Medications     Prior to Admission medications   Medication Sig Start Date End Date Taking? Authorizing Provider  cephALEXin (KEFLEX) 500 MG capsule Take 1 capsule (500 mg total) by mouth 2 (two) times daily for 7 days. 12/12/22 12/19/22 Yes Radford Pax, NP  Etonogestrel (NEXPLANON [place]) Inject 1 application into the skin once.     [provider]    Family History Family History  Problem Relation Age of Onset   Heart disease Maternal Grandfather    Cancer Paternal Grandmother        breast    Hypertension Maternal Grandmother    Leukemia Maternal Grandmother    Hypertension Mother     Social History Social History   Tobacco Use   Smoking status: Former    Years: 1    Types: Cigarettes   Smokeless tobacco: Never  Vaping Use   Vaping Use: Former  Substance Use Topics   Alcohol use: Yes    Comment: occ.   Drug use: No     Allergies   Patient has no known allergies.   Review of Systems Review of Systems  Genitourinary:  Positive for frequency  and vaginal discharge.     Physical Exam Triage Vital Signs ED Triage Vitals  Enc Vitals Group     BP 12/12/22 1419 108/64     Pulse Rate 12/12/22 1418 68     Resp 12/12/22 1418 17     Temp 12/12/22 1418 99.1 F (37.3 C)     Temp Source 12/12/22 1418 Oral     SpO2 12/12/22 1418 95 %     Weight --      Height --      Head Circumference --      Peak Flow --      Pain Score 12/12/22 1417 0     Pain Loc --      Pain Edu? --      Excl. in GC? --    No data found.  Updated Vital Signs BP 108/64   Pulse 68   Temp 99.1 F (37.3 C) (Oral)   Resp 17   LMP  (Approximate) Comment: pt is on testosterone for 2 years.  SpO2 95%   Visual Acuity Right Eye Distance:   Left Eye Distance:   Bilateral Distance:    Right Eye Near:   Left Eye Near:    Bilateral Near:     Physical Exam Vitals and nursing note reviewed.  Constitutional:      Appearance: Normal appearance.  HENT:     Head: Normocephalic and atraumatic.   Eyes:     Pupils: Pupils are equal, round, and reactive to light.  Cardiovascular:     Rate and Rhythm: Normal rate.  Pulmonary:     Effort: Pulmonary effort is normal.  Abdominal:     Tenderness: There is no right CVA tenderness or left CVA tenderness.  Skin:    General: Skin is warm and dry.  Neurological:     General: No focal deficit present.     Mental Status: She is alert and oriented to person, place, and time.  Psychiatric:        Mood and Affect: Mood normal.        Behavior: Behavior normal.      UC Treatments / Results  Labs (all labs ordered are listed, but only abnormal results are displayed) Labs Reviewed  POCT URINALYSIS DIP (MANUAL ENTRY) - Abnormal; Notable for the following components:      Result Value   Clarity, UA cloudy (*)    Ketones, POC UA trace (5) (*)    Blood, UA trace-intact (*)    Leukocytes, UA Large (3+) (*)    All other components within normal limits  URINE CULTURE  HIV ANTIBODY (ROUTINE TESTING W REFLEX)  RPR  POCT URINE PREGNANCY  CERVICOVAGINAL ANCILLARY ONLY    EKG   Radiology No results found.  Procedures Procedures (including critical care time)  Medications Ordered in UC Medications - No data to display  Initial Impression / Assessment and Plan / UC Course  I have reviewed the triage vital signs and the nursing notes.  Pertinent labs & imaging results that were available during my care of the patient were reviewed by me and considered in my medical decision making (see chart for details).     Reviewed exam and symptoms with patient.  No red flags.  Start Keflex and will send urine culture.  STD testing is ordered and will contact for any results.  Patient wants to await results prior to initiating any treatment for BV or yeast.  Rest and fluids.  PCP for symptoms do  not improve.  ER precautions reviewed and patient verbalized understanding. Final Clinical Impressions(s) / UC Diagnoses   Final diagnoses:  Acute  vaginitis  Urinary frequency  Acute cystitis without hematuria  Screening examination for STD (sexually transmitted disease)     Discharge Instructions      Start Keflex twice daily for 7 days.  The clinical contact you with results of the urine culture, vaginal swab/STD testing done today if it is positive.  Rest and fluids.  Please follow-up with your PCP if your symptoms do not improve.  Please go to the emergency room if you develop any worsening symptoms.  I hope you feel better soon!    ED Prescriptions     Medication Sig Dispense Auth. Provider   cephALEXin (KEFLEX) 500 MG capsule Take 1 capsule (500 mg total) by mouth 2 (two) times daily for 7 days. 14 capsule Radford Pax, NP      PDMP not reviewed this encounter.   Radford Pax, NP 12/12/22 [phone]

## 2022-12-12 NOTE — ED Triage Notes (Signed)
Pt presents with c/o possible BV or yeast infection. Pt states they recently changed body wash and started having vaginal discharge and itching X 2 days.

## 2022-12-12 NOTE — Discharge Instructions (Signed)
Start Keflex twice daily for 7 days.  The clinical contact you with results of the urine culture, vaginal swab/STD testing done today if it is positive.  Rest and fluids.  Please follow-up with your PCP if your symptoms do not improve.  Please go to the emergency room if you develop any worsening symptoms.  I hope you feel better soon!

## 2022-12-13 LAB — HIV ANTIBODY (ROUTINE TESTING W REFLEX): HIV Screen 4th Generation wRfx: NONREACTIVE

## 2022-12-13 LAB — RPR: RPR Ser Ql: NONREACTIVE

## 2022-12-14 LAB — URINE CULTURE: Culture: 2000 — AB

## 2022-12-15 LAB — CERVICOVAGINAL ANCILLARY ONLY
Bacterial Vaginitis (gardnerella): NEGATIVE
Candida Glabrata: NEGATIVE
Candida Vaginitis: NEGATIVE
Chlamydia: POSITIVE — AB
Comment: NEGATIVE
Comment: NEGATIVE
Comment: NEGATIVE
Comment: NEGATIVE
Comment: NEGATIVE
Comment: NORMAL
Neisseria Gonorrhea: POSITIVE — AB
Trichomonas: NEGATIVE

## 2022-12-16 ENCOUNTER — Telehealth: Payer: Self-pay

## 2022-12-16 ENCOUNTER — Telehealth (HOSPITAL_COMMUNITY): Payer: Self-pay | Admitting: Emergency Medicine

## 2022-12-16 ENCOUNTER — Ambulatory Visit
Admission: EM | Admit: 2022-12-16 | Discharge: 2022-12-16 | Disposition: A | Discharge status: Home / Self Care | Payer: BC Managed Care – PPO | Attending: Internal Medicine | Admitting: Internal Medicine

## 2022-12-16 DIAGNOSIS — A549 Gonococcal infection, unspecified: Secondary | ICD-10-CM

## 2022-12-16 MED ORDER — CEFTRIAXONE SODIUM 500 MG IJ SOLR: 500.0000 mg | INTRAMUSCULAR | Status: DC

## 2022-12-16 MED ORDER — DOXYCYCLINE HYCLATE 100 MG PO CAPS: 100.0000 mg | ORAL_CAPSULE | Freq: Two times a day (BID) | ORAL | 0 refills | Status: AC

## 2022-12-16 NOTE — ED Triage Notes (Signed)
Pt is here for STI-treatment for Gonorrhea. Rocephin 500 IMG given in Right Gluteal. Patient tolerated well.  NDC# [phone]

## 2022-12-16 NOTE — Telephone Encounter (Signed)
Per protocol, patient will need treatment with IM Rocephin 500mg for positive Gonorrhea.  Will also need treatment with Doxycycline Results seen on mychart Contacted patient by phone.  Verified identity using two identifiers.  Provided positive result.  Reviewed safe sex practices, notifying partners, and refraining from sexual activities for 7 days from time of treatment.  Patient verified understanding, all questions answered.   HHS notified Verified pharmacy, prescription sent 

## 2023-01-18 ENCOUNTER — Ambulatory Visit
Admission: RE | Admit: 2023-01-18 | Discharge: 2023-01-18 | Disposition: A | Discharge status: Home / Self Care | Payer: BC Managed Care – PPO | Source: Ambulatory Visit | Attending: Physician Assistant | Admitting: Physician Assistant

## 2023-01-18 VITALS — BP 103/62 | HR 82 | Temp 100.1°F | Resp 19

## 2023-01-18 DIAGNOSIS — B349 Viral infection, unspecified: Secondary | ICD-10-CM

## 2023-01-18 DIAGNOSIS — Z1152 Encounter for screening for COVID-19: Secondary | ICD-10-CM | POA: Insufficient documentation

## 2023-01-18 DIAGNOSIS — Z113 Encounter for screening for infections with a predominantly sexual mode of transmission: Secondary | ICD-10-CM | POA: Insufficient documentation

## 2023-01-18 DIAGNOSIS — G44209 Tension-type headache, unspecified, not intractable: Secondary | ICD-10-CM

## 2023-01-18 MED ORDER — KETOROLAC TROMETHAMINE 30 MG/ML IJ SOLN
30.0000 mg | Freq: Once | INTRAMUSCULAR | Status: AC
  Administered 2023-01-18: 30 mg via INTRAMUSCULAR

## 2023-01-18 MED ORDER — BACLOFEN 10 MG PO TABS: 10.0000 mg | ORAL_TABLET | Freq: Every evening | ORAL | 0 refills | Status: AC | PRN

## 2023-01-18 NOTE — ED Provider Notes (Signed)
[facility]    CSN: [id number] Arrival date & time: 01/18/23  1549      History   Chief Complaint Chief Complaint  Patient presents with   Neck Pain    Entered by patient    HPI Erika Lyons is a 32 y.o. adult.   Patient presents today with a several day history of intermittent neck pain and headache.  Reports that currently pain is rated 7 on a 0-10 pain scale, described as tightness/pressure, no aggravating alleviating factors identified.  Patient does have a history of headaches.  Has taken Tylenol PM with minimal improvement of symptoms.  Denies any known sick contacts but does work at [employer] where they have a very strict attendance policy and people often come to work is sick.  Patient has never had COVID or COVID-19 vaccines.  Does report fever and chills but denies any significant cough, congestion, sore throat, nausea, vomiting.  Does report associated neck discomfort but this has improved.  Denies any neck stiffness or severe pain today.  Reports only having a headache currently.    Past Medical History:  Diagnosis Date   Asthma    Depression    Eczema    Headache above the eye region 01/18/2016   Menstrual extraction 01/22/2014   Nexplanon in place 03/09/2015   Nexplanon insertion 07/01/2014   Inserted left arm 07/01/14 remove 07/01/17   Pancreatitis 12/2017   Vitamin D deficiency 01/19/2016    Patient Active Problem List   Diagnosis Date Noted   Vaginal discharge 04/10/2020   Screening examination for STD (sexually transmitted disease) 02/23/2018   Encounter for well woman exam with routine gynecological exam 02/23/2018   History of pancreatitis 02/23/2018   Vitamin D deficiency 01/19/2016   Headache above the eye region 01/18/2016   Nexplanon in place 03/09/2015   Nexplanon insertion 07/01/2014   Major depression, single episode 03/21/2014   Overdose 03/21/2014   Menstrual extraction 01/22/2014   Asthma 10/15/2012   Eczema 10/15/2012    History  reviewed. No pertinent surgical history.  OB History     Gravida  0   Para  0   Term      Preterm      AB      Living         SAB      IAB      Ectopic      Multiple      Live Births               Home Medications    Prior to Admission medications   Medication Sig Start Date End Date Taking? Authorizing Provider  baclofen (LIORESAL) 10 MG tablet Take 1 tablet (10 mg total) by mouth at bedtime as needed for muscle spasms. 01/18/23  Yes Jeri Jeanbaptiste K, PA-C  Etonogestrel (NEXPLANON [place]) Inject 1 application into the skin once.     [provider]    Family History Family History  Problem Relation Age of Onset   Heart disease Maternal Grandfather    Cancer Paternal Grandmother        breast    Hypertension Maternal Grandmother    Leukemia Maternal Grandmother    Hypertension Mother     Social History Social History   Tobacco Use   Smoking status: Former    Types: Cigarettes   Smokeless tobacco: Never  Vaping Use   Vaping status: Former  Substance Use Topics   Alcohol use: Yes    Comment:  occ.   Drug use: No     Allergies   Nickel   Review of Systems Review of Systems  Constitutional:  Positive for activity change, chills and fever. Negative for appetite change and fatigue.  HENT:  Negative for congestion, sinus pressure, sneezing and sore throat.   Respiratory:  Negative for cough and shortness of breath.   Cardiovascular:  Negative for chest pain.  Gastrointestinal:  Negative for abdominal pain, diarrhea, nausea and vomiting.  Neurological:  Positive for headaches. Negative for dizziness, seizures, syncope, facial asymmetry, speech difficulty, weakness, light-headedness and numbness.     Physical Exam Triage Vital Signs ED Triage Vitals  Encounter Vitals Group     BP 01/18/23 1629 103/62     Systolic BP Percentile --      Diastolic BP Percentile --      Pulse Rate 01/18/23 1629 82     Resp 01/18/23 1629 19     Temp  01/18/23 1629 100.1 F (37.8 C)     Temp Source 01/18/23 1629 Oral     SpO2 01/18/23 1629 97 %     Weight --      Height --      Head Circumference --      Peak Flow --      Pain Score 01/18/23 1628 7     Pain Loc --      Pain Education --      Exclude from Growth Chart --    No data found.  Updated Vital Signs BP 103/62 (BP Location: Right Arm)   Pulse 82   Temp 100.1 F (37.8 C) (Oral)   Resp 19   SpO2 97%   Visual Acuity Right Eye Distance:   Left Eye Distance:   Bilateral Distance:    Right Eye Near:   Left Eye Near:    Bilateral Near:     Physical Exam Vitals reviewed.  Constitutional:      General: She is awake. She is not in acute distress.    Appearance: Normal appearance. She is well-developed. She is not ill-appearing.     Comments: Very pleasant, appears stated age, no acute distress, sitting comfortably in exam room  HENT:     Head: Normocephalic and atraumatic.     Right Ear: Tympanic membrane, ear canal and external ear normal. Tympanic membrane is not erythematous or bulging.     Left Ear: Tympanic membrane, ear canal and external ear normal. Tympanic membrane is not erythematous or bulging.     Nose:     Right Sinus: No maxillary sinus tenderness or frontal sinus tenderness.     Left Sinus: No maxillary sinus tenderness or frontal sinus tenderness.     Mouth/Throat:     Pharynx: Uvula midline. No oropharyngeal exudate or posterior oropharyngeal erythema.  Cardiovascular:     Rate and Rhythm: Normal rate and regular rhythm.     Heart sounds: Normal heart sounds, S1 normal and S2 normal. No murmur heard. Pulmonary:     Effort: Pulmonary effort is normal.     Breath sounds: Normal breath sounds. No wheezing, rhonchi or rales.     Comments: Clear to auscultation bilaterally Musculoskeletal:     Cervical back: No spasms, tenderness or bony tenderness.  Neurological:     General: No focal deficit present.     Mental Status: She is alert and oriented  to person, place, and time.     Cranial Nerves: Cranial nerves 2-12 are intact.  Motor: Motor function is intact.     Coordination: Coordination is intact.     Gait: Gait is intact.     Comments: Cranial nerves II through XII grossly intact.  No focal neurological defect on exam.  Psychiatric:        Behavior: Behavior is cooperative.      UC Treatments / Results  Labs (all labs ordered are listed, but only abnormal results are displayed) Labs Reviewed  SARS CORONAVIRUS 2 (TAT 6-24 HRS)    EKG   Radiology No results found.  Procedures Procedures (including critical care time)  Medications Ordered in UC Medications  ketorolac (TORADOL) 30 MG/ML injection 30 mg (30 mg Intramuscular Given 01/18/23 1707)    Initial Impression / Assessment and Plan / UC Course  I have reviewed the triage vital signs and the nursing notes.  Pertinent labs & imaging results that were available during my care of the patient were reviewed by me and considered in my medical decision making (see chart for details).     Patient is well-appearing, afebrile, nontoxic, nontachycardic.  Vital signs and physical exam are reassuring today.  Patient has no current neck tenderness or pain and has full active range of motion so low suspicion for meningitis.  Concern for COVID given clinical presentation.  Patient was tested for COVID and these results are pending.  We will contact them if positive.  They are young and otherwise healthy is not a candidate for antiviral therapy.  They were given Toradol in clinic with significant improvement of symptoms; pain improved to a 3 on a 0-10 pain scale from 7.  They were instructed not to take NSAIDs for 24 hours but can use over-the-counter medication such as acetaminophen/Tylenol for breakthrough pain.  They were also sent in baclofen to help with headache and neck discomfort and we discussed that this can be sedating and they are not to drive or drink alcohol while  taking it.  They are to rest and drink plenty of fluid.  Discussed that if symptoms or not improving within a few days they should return for reevaluation.  Recommend close follow-up with PCP soon as possible.  Discussed at length that if anything worsens and they have high fever not responding to medication, severe headache, worst headache of life, neck pain or decreased range of motion of the neck, nausea, vomiting weakness they need to be seen immediately.  Strict return precautions given.  Work excuse note provided.  Final Clinical Impressions(s) / UC Diagnoses   Final diagnoses:  Viral illness  Tension headache     Discharge Instructions      I am glad that you are feeling better after your injection.  Do not take any NSAIDs for the next 24 hours including aspirin, ibuprofen/Advil, naproxen/Aleve.  You can use acetaminophen/Tylenol.  Use baclofen at night.  This make you sleepy so do not drive or drink alcohol with taking it.  I am concerned that you have a viral infection such as COVID.  Monitor your MyChart for these results.  We will contact you if you are positive.  Make sure that you are resting and drinking plenty of fluid.  If your symptoms or not improving within a week please return for reevaluation.  If anything worsens and you have neck stiffness or difficulty moving your neck, severe headache, worst headache of your life, fever, nausea/vomiting, vision change, weakness you need to go to the emergency room.  Follow-up with your primary care soon as possible.  ED Prescriptions     Medication Sig Dispense Auth. Provider   baclofen (LIORESAL) 10 MG tablet Take 1 tablet (10 mg total) by mouth at bedtime as needed for muscle spasms. 10 each Malisha Mabey, Noberto Retort, PA-C      PDMP not reviewed this encounter.   Jeani Hawking, PA-C 01/18/23 1733

## 2023-01-18 NOTE — Discharge Instructions (Signed)
I am glad that you are feeling better after your injection.  Do not take any NSAIDs for the next 24 hours including aspirin, ibuprofen/Advil, naproxen/Aleve.  You can use acetaminophen/Tylenol.  Use baclofen at night.  This make you sleepy so do not drive or drink alcohol with taking it.  I am concerned that you have a viral infection such as COVID.  Monitor your MyChart for these results.  We will contact you if you are positive.  Make sure that you are resting and drinking plenty of fluid.  If your symptoms or not improving within a week please return for reevaluation.  If anything worsens and you have neck stiffness or difficulty moving your neck, severe headache, worst headache of your life, fever, nausea/vomiting, vision change, weakness you need to go to the emergency room.  Follow-up with your primary care soon as possible.

## 2023-01-18 NOTE — ED Triage Notes (Signed)
Pt presents with c/o neck pain that causes headaches x 1 wk. States they might have pulled a muscle and reports limited ROM. Took a tylenol at 6am. States being in the cold causes more pain to the neck.

## 2023-08-30 ENCOUNTER — Ambulatory Visit
Admission: RE | Admit: 2023-08-30 | Discharge: 2023-08-30 | Disposition: A | Discharge status: Home / Self Care | Payer: Self-pay | Source: Ambulatory Visit | Attending: Family Medicine | Admitting: Family Medicine

## 2023-08-30 VITALS — BP 115/59 | HR 66 | Temp 98.8°F | Resp 16

## 2023-08-30 DIAGNOSIS — N898 Other specified noninflammatory disorders of vagina: Secondary | ICD-10-CM | POA: Insufficient documentation

## 2023-08-30 DIAGNOSIS — Z113 Encounter for screening for infections with a predominantly sexual mode of transmission: Secondary | ICD-10-CM | POA: Insufficient documentation

## 2023-08-30 NOTE — Discharge Instructions (Signed)
 The clinical contact you with the testing today if positive.  Follow-up with your PCP as needed.

## 2023-08-30 NOTE — ED Triage Notes (Signed)
 Pt presents for STD testing. Pt is also requesting blood work. States they have vaginal discharge.

## 2023-08-30 NOTE — ED Provider Notes (Signed)
 [facility]    CSN: [id number] Arrival date & time: 08/30/23  1146      History   Chief Complaint Chief Complaint  Patient presents with   Exposure to STD    Entered by patient    HPI Erika Lyons is a 33 y.o. adult presents for STD testing and vaginal discharge.  Patient reports 3 days of a vaginal discharge that is neither pruritic nor malodorous.  Denies any dysuria, fevers, nausea/vomiting, flank pain.  No known STD exposure but would like screening.  No other concerns at this time.   Exposure to STD    Past Medical History:  Diagnosis Date   Asthma    Depression    Eczema    Headache above the eye region 01/18/2016   Menstrual extraction 01/22/2014   Nexplanon in place 03/09/2015   Nexplanon insertion 07/01/2014   Inserted left arm 07/01/14 remove 07/01/17   Pancreatitis 12/2017   Vitamin D deficiency 01/19/2016    Patient Active Problem List   Diagnosis Date Noted   Vaginal discharge 04/10/2020   Screening examination for STD (sexually transmitted disease) 02/23/2018   Encounter for well woman exam with routine gynecological exam 02/23/2018   History of pancreatitis 02/23/2018   Vitamin D deficiency 01/19/2016   Headache above the eye region 01/18/2016   Nexplanon in place 03/09/2015   Nexplanon insertion 07/01/2014   Major depression, single episode 03/21/2014   Overdose 03/21/2014   Encounter for menstrual extraction 01/22/2014   Asthma 10/15/2012   Eczema 10/15/2012    History reviewed. No pertinent surgical history.  OB History     Gravida  0   Para  0   Term      Preterm      AB      Living         SAB      IAB      Ectopic      Multiple      Live Births               Home Medications    Prior to Admission medications   Medication Sig Start Date End Date Taking? Authorizing Provider  baclofen (LIORESAL) 10 MG tablet Take 1 tablet (10 mg total) by mouth at bedtime as needed for muscle spasms. 01/18/23   Raspet,  Noberto Retort, PA-C  Etonogestrel (NEXPLANON [place]) Inject 1 application into the skin once.     [provider]    Family History Family History  Problem Relation Age of Onset   Heart disease Maternal Grandfather    Cancer Paternal Grandmother        breast    Hypertension Maternal Grandmother    Leukemia Maternal Grandmother    Hypertension Mother     Social History Social History   Tobacco Use   Smoking status: Former    Types: Cigarettes   Smokeless tobacco: Never  Vaping Use   Vaping status: Former  Substance Use Topics   Alcohol use: Yes    Comment: occ.   Drug use: No     Allergies   Nickel   Review of Systems Review of Systems  Genitourinary:  Positive for vaginal discharge.     Physical Exam Triage Vital Signs ED Triage Vitals  Encounter Vitals Group     BP 08/30/23 1214 (!) 115/59     Systolic BP Percentile --      Diastolic BP Percentile --      Pulse Rate 08/30/23  1213 66     Resp 08/30/23 1213 16     Temp 08/30/23 1214 98.8 F (37.1 C)     Temp Source 08/30/23 1214 Oral     SpO2 08/30/23 1213 96 %     Weight --      Height --      Head Circumference --      Peak Flow --      Pain Score 08/30/23 1212 0     Pain Loc --      Pain Education --      Exclude from Growth Chart --    No data found.  Updated Vital Signs BP (!) 115/59   Pulse 66   Temp 98.8 F (37.1 C) (Oral)   Resp 16   SpO2 96%   Visual Acuity Right Eye Distance:   Left Eye Distance:   Bilateral Distance:    Right Eye Near:   Left Eye Near:    Bilateral Near:     Physical Exam Vitals and nursing note reviewed.  Constitutional:      Appearance: Normal appearance.  HENT:     Head: Normocephalic and atraumatic.  Eyes:     Pupils: Pupils are equal, round, and reactive to light.  Cardiovascular:     Rate and Rhythm: Normal rate.  Pulmonary:     Effort: Pulmonary effort is normal.  Skin:    General: Skin is warm and dry.  Neurological:     General: No  focal deficit present.     Mental Status: She is alert and oriented to person, place, and time.  Psychiatric:        Mood and Affect: Mood normal.        Behavior: Behavior normal.      UC Treatments / Results  Labs (all labs ordered are listed, but only abnormal results are displayed) Labs Reviewed  RPR  HIV ANTIBODY (ROUTINE TESTING W REFLEX)  CERVICOVAGINAL ANCILLARY ONLY    EKG   Radiology No results found.  Procedures Procedures (including critical care time)  Medications Ordered in UC Medications - No data to display  Initial Impression / Assessment and Plan / UC Course  I have reviewed the triage vital signs and the nursing notes.  Pertinent labs & imaging results that were available during my care of the patient were reviewed by me and considered in my medical decision making (see chart for details).     Vaginal swab/STD testing as ordered and will contact for any positive results.  Patient prefers to await results prior to initiating any treatment.  Follow-up with PCP as needed. Final Clinical Impressions(s) / UC Diagnoses   Final diagnoses:  Vaginal discharge  Screening examination for STD (sexually transmitted disease)     Discharge Instructions      The clinical contact you with the testing today if positive.  Follow-up with your PCP as needed.    ED Prescriptions   None    PDMP not reviewed this encounter.   Radford Pax, NP 08/30/23 1244

## 2023-08-31 ENCOUNTER — Telehealth (HOSPITAL_COMMUNITY): Payer: Self-pay

## 2023-08-31 LAB — CERVICOVAGINAL ANCILLARY ONLY
Bacterial Vaginitis (gardnerella): NEGATIVE
Candida Glabrata: NEGATIVE
Candida Vaginitis: NEGATIVE
Chlamydia: NEGATIVE
Comment: NEGATIVE
Comment: NEGATIVE
Comment: NEGATIVE
Comment: NEGATIVE
Comment: NEGATIVE
Comment: NORMAL
Neisseria Gonorrhea: POSITIVE — AB
Trichomonas: POSITIVE — AB

## 2023-08-31 LAB — HIV ANTIBODY (ROUTINE TESTING W REFLEX): HIV Screen 4th Generation wRfx: NONREACTIVE

## 2023-08-31 LAB — RPR: RPR Ser Ql: NONREACTIVE

## 2023-08-31 MED ORDER — METRONIDAZOLE 500 MG PO TABS: 500.0000 mg | ORAL_TABLET | Freq: Two times a day (BID) | ORAL | 0 refills | Status: AC

## 2023-08-31 NOTE — Telephone Encounter (Signed)
 Per protocol, pt will need to return to UC for Nurse Visit for Rocephin 500mg  IM.  Per protocol, pt requires tx with metronidazole. Reviewed with patient, verified pharmacy, prescription sent.

## 2023-09-01 ENCOUNTER — Ambulatory Visit
Admission: RE | Admit: 2023-09-01 | Discharge: 2023-09-01 | Disposition: A | Discharge status: Home / Self Care | Source: Ambulatory Visit | Attending: Urgent Care | Admitting: Urgent Care

## 2023-09-01 DIAGNOSIS — A549 Gonococcal infection, unspecified: Secondary | ICD-10-CM | POA: Diagnosis not present

## 2023-09-01 MED ORDER — CEFTRIAXONE SODIUM 500 MG IJ SOLR
500.0000 mg | INTRAMUSCULAR | Status: DC
  Administered 2023-09-01: 500 mg via INTRAMUSCULAR

## 2023-09-01 NOTE — ED Provider Notes (Signed)
 Patient here for visit only.  Needs treatment of gonorrhea with IM ceftriaxone 500 mg.  This was administered in clinic.   Erika Lyons, [place] 09/01/23 1042

## 2023-09-01 NOTE — ED Triage Notes (Signed)
 Pt presents for Rocephin injection for STD's treatment.

## 2023-09-10 ENCOUNTER — Ambulatory Visit: Payer: Self-pay

## 2023-09-13 ENCOUNTER — Ambulatory Visit

## 2023-10-01 ENCOUNTER — Ambulatory Visit
Admission: RE | Admit: 2023-10-01 | Discharge: 2023-10-01 | Disposition: A | Discharge status: Home / Self Care | Source: Ambulatory Visit | Attending: Family Medicine | Admitting: Family Medicine

## 2023-10-01 VITALS — BP 100/67 | HR 67 | Temp 98.6°F | Resp 16

## 2023-10-01 DIAGNOSIS — N760 Acute vaginitis: Secondary | ICD-10-CM | POA: Diagnosis present

## 2023-10-01 DIAGNOSIS — Z113 Encounter for screening for infections with a predominantly sexual mode of transmission: Secondary | ICD-10-CM | POA: Diagnosis present

## 2023-10-01 DIAGNOSIS — Z87891 Personal history of nicotine dependence: Secondary | ICD-10-CM | POA: Diagnosis not present

## 2023-10-01 MED ORDER — CEFTRIAXONE SODIUM 500 MG IJ SOLR
500.0000 mg | INTRAMUSCULAR | Status: DC
  Administered 2023-10-01: 500 mg via INTRAMUSCULAR

## 2023-10-01 MED ORDER — METRONIDAZOLE 500 MG PO TABS: 500.0000 mg | ORAL_TABLET | Freq: Two times a day (BID) | ORAL | 0 refills | Status: DC

## 2023-10-01 NOTE — ED Provider Notes (Signed)
 [facility]  Note:  This document was prepared using Conservation officer, historic buildings and may include unintentional dictation errors.  MRN: [id number] DOB: 05/18/1991  Subjective:   Karie Skowron is a 33 y.o. adult presenting for 3-day history of recurrent vaginal discharge, pelvic discomfort.  Approximately 1 month ago, patient tested positive for gonorrhea, trichomonas.  Medications were taken as prescribed but she feels like she never really cleared the infection.  Is requesting empiric treatment and complete STI testing.  No fever, nausea, vomiting, urinary symptoms.  No concern for pregnancy.  No current facility-administered medications for this encounter.  Current Outpatient Medications:    baclofen (LIORESAL) 10 MG tablet, Take 1 tablet (10 mg total) by mouth at bedtime as needed for muscle spasms., Disp: 10 each, Rfl: 0   Etonogestrel (NEXPLANON [place]), Inject 1 application into the skin once. , Disp: , Rfl:    testosterone cypionate (DEPOTESTOSTERONE CYPIONATE) 200 MG/ML injection, SMARTSIG:0.5 Milliliter(s) IM Once a Week, Disp: , Rfl:    testosterone cypionate (DEPOTESTOTERONE CYPIONATE) 100 MG/ML injection, Inject into the muscle., Disp: , Rfl:    Allergies  Allergen Reactions   Nickel Rash    Past Medical History:  Diagnosis Date   Asthma    Depression    Eczema    Headache above the eye region 01/18/2016   Menstrual extraction 01/22/2014   Nexplanon in place 03/09/2015   Nexplanon insertion 07/01/2014   Inserted left arm 07/01/14 remove 07/01/17   Pancreatitis 12/2017   Vitamin D deficiency 01/19/2016     History reviewed. No pertinent surgical history.  Family History  Problem Relation Age of Onset   Heart disease Maternal Grandfather    Cancer Paternal Grandmother        breast    Hypertension Maternal Grandmother    Leukemia Maternal Grandmother    Hypertension Mother     Social History   Tobacco Use   Smoking status: Former     Types: Cigarettes   Smokeless tobacco: Never  Vaping Use   Vaping status: Former  Substance Use Topics   Alcohol use: Yes    Comment: occ.   Drug use: No    ROS   Objective:   Vitals: BP 100/67 (BP Location: Right Arm)   Pulse 67   Temp 98.6 F (37 C) (Oral)   Resp 16   SpO2 96%   Physical Exam Constitutional:      General: She is not in acute distress.    Appearance: Normal appearance. She is well-developed and normal weight. She is not ill-appearing, toxic-appearing or diaphoretic.  HENT:     Head: Normocephalic and atraumatic.     Right Ear: External ear normal.     Left Ear: External ear normal.     Nose: Nose normal.     Mouth/Throat:     Mouth: Mucous membranes are moist.     Pharynx: Oropharynx is clear.  Eyes:     General: No scleral icterus.       Right eye: No discharge.        Left eye: No discharge.     Extraocular Movements: Extraocular movements intact.     Conjunctiva/sclera: Conjunctivae normal.  Cardiovascular:     Rate and Rhythm: Normal rate.  Pulmonary:     Effort: Pulmonary effort is normal.  Abdominal:     General: Bowel sounds are normal. There is no distension.     Palpations: Abdomen is soft. There is no mass.  Tenderness: There is no abdominal tenderness. There is no right CVA tenderness, left CVA tenderness, guarding or rebound.  Musculoskeletal:     Cervical back: Normal range of motion.  Skin:    General: Skin is warm and dry.  Neurological:     General: No focal deficit present.     Mental Status: She is alert and oriented to person, place, and time.  Psychiatric:        Mood and Affect: Mood normal.        Behavior: Behavior normal.        Thought Content: Thought content normal.        Judgment: Judgment normal.    IM ceftriaxone 500 mg administered in clinic.   Assessment and Plan :   PDMP not reviewed this encounter.  1. Acute vaginitis   2. Screen for STD (sexually transmitted disease)    Will treat  empirically covering for her most recent infections including gonorrhea and trichomonas.  Labs pending, will update treatment plan as necessary.  Counseled patient on potential for adverse effects with medications prescribed/recommended today, ER and return-to-clinic precautions discussed, patient verbalized understanding.    Adolph Hoop, PA-C 10/01/23 1224

## 2023-10-01 NOTE — Discharge Instructions (Signed)
 Avoid all forms of sexual intercourse (oral, vaginal, anal) for the next 7 days to avoid spreading/reinfecting or at least until we can see what kinds of infection results are positive.  Abstaining for 2 weeks would be better but at least 1 week is required.  We will let you know about your test results from the swab we did today and if you need any prescriptions for antibiotics or changes to your treatment from today.

## 2023-10-01 NOTE — ED Triage Notes (Signed)
Pt c/o penile d/c x 3 days-NAD-steady gait

## 2023-10-03 LAB — RPR: RPR Ser Ql: NONREACTIVE

## 2023-10-03 LAB — HIV ANTIBODY (ROUTINE TESTING W REFLEX): HIV Screen 4th Generation wRfx: NONREACTIVE

## 2023-10-04 LAB — CERVICOVAGINAL ANCILLARY ONLY
Bacterial Vaginitis (gardnerella): NEGATIVE
Candida Glabrata: NEGATIVE
Candida Vaginitis: NEGATIVE
Chlamydia: NEGATIVE
Comment: NEGATIVE
Comment: NEGATIVE
Comment: NEGATIVE
Comment: NEGATIVE
Comment: NEGATIVE
Comment: NORMAL
Neisseria Gonorrhea: NEGATIVE
Trichomonas: POSITIVE — AB

## 2024-06-07 ENCOUNTER — Ambulatory Visit
Admission: EM | Admit: 2024-06-07 | Discharge: 2024-06-07 | Disposition: A | Discharge status: Home / Self Care | Attending: Family Medicine | Admitting: Family Medicine

## 2024-06-07 DIAGNOSIS — N898 Other specified noninflammatory disorders of vagina: Secondary | ICD-10-CM | POA: Insufficient documentation

## 2024-06-07 NOTE — Discharge Instructions (Addendum)
 Your test results should be available Monday.  Our results team will let you know if you need any kind of treatment for any positive test results. If you continue to have symptoms over the weekend, let me know and I can send in a prescription for BV and yeast infection.

## 2024-06-07 NOTE — ED Provider Notes (Signed)
 " [facility]  Note:  This document was prepared using Conservation officer, historic buildings and may include unintentional dictation errors.  MRN: [id number] DOB: [birth year]  Subjective:   Erika Lyons is a 33 y.o. adult presenting for vaginal cytology testing. Denies fever, n/v, abdominal pain, pelvic pain, rashes, dysuria, urinary frequency, hematuria.  Has a history of multiple infections over the past year including gonorrhea, trichomonas, chlamydia, bacterial vaginosis. No known exposures. Declines HIV, RPR testing.   Current Outpatient Medications  Medication Instructions   baclofen  (LIORESAL ) 10 mg, Oral, At bedtime PRN   Etonogestrel  (NEXPLANON  [place]) 1 application , Subcutaneous,  Once   metroNIDAZOLE  (FLAGYL ) 500 mg, Oral, 2 times daily with meals, DO NOT CONSUME ALCOHOL WHILE TAKING THIS MEDICATION.   testosterone cypionate (DEPOTESTOSTERONE CYPIONATE) 200 MG/ML injection SMARTSIG:0.5 Milliliter(s) IM Once a Week   testosterone cypionate (DEPOTESTOTERONE CYPIONATE) 100 MG/ML injection Intramuscular    Allergies[1]  Past Medical History:  Diagnosis Date   Asthma    Depression    Eczema    Headache above the eye region 01/18/2016   Menstrual extraction 01/22/2014   Nexplanon  in place 03/09/2015   Nexplanon  insertion 07/01/2014   Inserted left arm 07/01/14 remove 07/01/17   Pancreatitis 12/2017   Vitamin D  deficiency 01/19/2016     History reviewed. No pertinent surgical history.  Family History  Problem Relation Age of Onset   Heart disease Maternal Grandfather    Cancer Paternal Grandmother        breast    Hypertension Maternal Grandmother    Leukemia Maternal Grandmother    Hypertension Mother     Social History   Occupational History   Not on file  Tobacco Use   Smoking status: Never   Smokeless tobacco: Never  Vaping Use   Vaping status: Former  Substance and Sexual Activity   Alcohol use: Yes    Comment: occ   Drug use: No   Sexual  activity: Yes    Birth control/protection: None     ROS   Objective:   Vitals: BP (!) 107/50 (BP Location: Right Arm)   Pulse 71   Temp 98 F (36.7 C) (Oral)   Resp 20   SpO2 97% Blood pressure (!) 107/50, pulse 71, temperature 98 F (36.7 C), temperature source Oral, resp. rate 20, SpO2 97%.  Physical Exam Constitutional:      General: She is not in acute distress.    Appearance: Normal appearance. She is well-developed and normal weight. She is not ill-appearing, toxic-appearing or diaphoretic.  HENT:     Head: Normocephalic and atraumatic.     Right Ear: External ear normal.     Left Ear: External ear normal.     Nose: Nose normal.     Mouth/Throat:     Pharynx: Oropharynx is clear.  Eyes:     General: No scleral icterus.       Right eye: No discharge.        Left eye: No discharge.     Extraocular Movements: Extraocular movements intact.  Cardiovascular:     Rate and Rhythm: Normal rate.  Pulmonary:     Effort: Pulmonary effort is normal.  Musculoskeletal:     Cervical back: Normal range of motion.  Neurological:     Mental Status: She is alert and oriented to person, place, and time.  Psychiatric:        Mood and Affect: Mood normal.        Behavior: Behavior  normal.        Thought Content: Thought content normal.        Judgment: Judgment normal.    Assessment and Plan :   PDMP not reviewed this encounter.  1. Vaginal discharge     Will treat as appropriate based off of lab results at patient's preference and I am in agreement. Would be willing to cover for BV, trichomonas with Flagyl  over the weekend if the patient changes their mind over the weekend. Labs pending.    [1]  Allergies Allergen Reactions   Nickel Rash     Christopher Savannah, PA-C 06/07/24 1034  "

## 2024-06-07 NOTE — ED Triage Notes (Signed)
 Pt requesting STD testing-c/o vaginal d/c x 2-3 days-NAD-steady gait

## 2024-06-10 LAB — CERVICOVAGINAL ANCILLARY ONLY
Bacterial Vaginitis (gardnerella): NEGATIVE
Candida Glabrata: NEGATIVE
Candida Vaginitis: NEGATIVE
Chlamydia: NEGATIVE
Comment: NEGATIVE
Comment: NEGATIVE
Comment: NEGATIVE
Comment: NEGATIVE
Comment: NEGATIVE
Comment: NORMAL
Neisseria Gonorrhea: NEGATIVE
Trichomonas: NEGATIVE

## 2024-06-12 ENCOUNTER — Ambulatory Visit
Admission: EM | Admit: 2024-06-12 | Discharge: 2024-06-12 | Disposition: A | Discharge status: Home / Self Care | Attending: Family Medicine | Admitting: Family Medicine

## 2024-06-12 DIAGNOSIS — N760 Acute vaginitis: Secondary | ICD-10-CM

## 2024-06-12 DIAGNOSIS — N3000 Acute cystitis without hematuria: Secondary | ICD-10-CM

## 2024-06-12 DIAGNOSIS — R1024 Suprapubic pain: Secondary | ICD-10-CM

## 2024-06-12 LAB — POCT URINE DIPSTICK
Bilirubin, UA: NEGATIVE
Blood, UA: NEGATIVE
Glucose, UA: NEGATIVE mg/dL
Ketones, POC UA: NEGATIVE mg/dL
Nitrite, UA: NEGATIVE
Protein Ur, POC: NEGATIVE mg/dL
Spec Grav, UA: 1.02
Urobilinogen, UA: 0.2 U/dL
pH, UA: 7

## 2024-06-12 MED ORDER — FLUCONAZOLE 150 MG PO TABS: 150.0000 mg | ORAL_TABLET | ORAL | 0 refills | Status: AC

## 2024-06-12 MED ORDER — METRONIDAZOLE 500 MG PO TABS: 500.0000 mg | ORAL_TABLET | Freq: Two times a day (BID) | ORAL | 0 refills | Status: AC

## 2024-06-12 MED ORDER — CEPHALEXIN 500 MG PO CAPS: 500.0000 mg | ORAL_CAPSULE | Freq: Two times a day (BID) | ORAL | 0 refills | Status: AC

## 2024-06-12 NOTE — Discharge Instructions (Signed)
 Please start cephalexin  to address an urinary tract infection. Will also address suspected BV infection given your persistent discharge despite negative test results. Use fluconazole  to help against yeast infection from the antibiotics. Make sure you hydrate very well with plain water and a quantity of 80 ounces of water a day.  Please limit drinks that are considered urinary irritants such as fruit juices, soda, sweet tea, coffee, artifical sweetened drinks, energy drinks, alcohol.  These can worsen your urinary and genital symptoms but also be the source of them.  I will let you know about your urine culture results through MyChart to see if we need to prescribe or change your antibiotics based off of those results.

## 2024-06-12 NOTE — ED Provider Notes (Signed)
 " [facility]  Note:  This document was prepared using Conservation officer, historic buildings and may include unintentional dictation errors.  MRN: [id number] DOB: 1990-07-22  Subjective:   Erika Lyons is a 33 y.o. adult presenting for recheck.  Patient has ongoing vaginal discharge going on 3 weeks now.  She was seen 5 days ago, tested negative with a complete vaginal cytology.  Symptoms are unchanged, did not get any prescriptions for her home medication at her request.  Now she has developed some right sided pelvic pain worse when she urinates.  Tries to hydrate well.  No concern for pregnancy.  Current Outpatient Medications  Medication Instructions   baclofen  (LIORESAL ) 10 mg, Oral, At bedtime PRN   Etonogestrel  (NEXPLANON  [place]) 1 application ,  Once   metroNIDAZOLE  (FLAGYL ) 500 mg, Oral, 2 times daily with meals, DO NOT CONSUME ALCOHOL WHILE TAKING THIS MEDICATION.   testosterone cypionate (DEPOTESTOSTERONE CYPIONATE) 200 MG/ML injection SMARTSIG:0.5 Milliliter(s) IM Once a Week   testosterone cypionate (DEPOTESTOTERONE CYPIONATE) 100 MG/ML injection Inject into the muscle.    Allergies[1]  Past Medical History:  Diagnosis Date   Asthma    Depression    Eczema    Headache above the eye region 01/18/2016   Menstrual extraction 01/22/2014   Nexplanon  in place 03/09/2015   Nexplanon  insertion 07/01/2014   Inserted left arm 07/01/14 remove 07/01/17   Pancreatitis 12/2017   Vitamin D  deficiency 01/19/2016     History reviewed. No pertinent surgical history.  Family History  Problem Relation Age of Onset   Heart disease Maternal Grandfather    Cancer Paternal Grandmother        breast    Hypertension Maternal Grandmother    Leukemia Maternal Grandmother    Hypertension Mother     Social History   Occupational History   Not on file  Tobacco Use   Smoking status: Never   Smokeless tobacco: Never  Vaping Use   Vaping status: Former  Substance and Sexual  Activity   Alcohol use: Yes    Comment: occ   Drug use: No   Sexual activity: Yes    Birth control/protection: None     ROS   Objective:   Vitals: BP (!) 101/55 (BP Location: Right Arm)   Pulse (!) 54   Temp 98.4 F (36.9 C)   Resp 19   Ht 5' 5 (1.651 m)   Wt 190 lb (86.2 kg)   SpO2 96%   BMI 31.62 kg/m   Physical Exam Constitutional:      General: She is not in acute distress.    Appearance: Normal appearance. She is well-developed. She is not ill-appearing, toxic-appearing or diaphoretic.  HENT:     Head: Normocephalic and atraumatic.     Nose: Nose normal.     Mouth/Throat:     Mouth: Mucous membranes are moist.  Eyes:     General: No scleral icterus.       Right eye: No discharge.        Left eye: No discharge.     Extraocular Movements: Extraocular movements intact.     Conjunctiva/sclera: Conjunctivae normal.  Cardiovascular:     Rate and Rhythm: Normal rate.  Pulmonary:     Effort: Pulmonary effort is normal.  Abdominal:     General: Bowel sounds are normal. There is no distension.     Palpations: Abdomen is soft. There is no mass.     Tenderness: There is abdominal tenderness (mid  suprapubic to right pelvic area). There is no right CVA tenderness, left CVA tenderness, guarding or rebound.  Skin:    General: Skin is warm and dry.  Neurological:     General: No focal deficit present.     Mental Status: She is alert and oriented to person, place, and time.  Psychiatric:        Mood and Affect: Mood normal.        Behavior: Behavior normal.        Thought Content: Thought content normal.        Judgment: Judgment normal.     Results for orders placed or performed during the hospital encounter of 06/12/24 (from the past 24 hours)  POCT URINE DIPSTICK     Status: Abnormal   Collection Time: 06/12/24  1:11 PM  Result Value Ref Range   Color, UA yellow yellow   Clarity, UA clear clear   Glucose, UA negative negative mg/dL   Bilirubin, UA negative  negative   Ketones, POC UA negative negative mg/dL   Spec Grav, UA 8.979 8.989 - 1.025   Blood, UA negative negative   pH, UA 7.0 5.0 - 8.0   Protein Ur, POC negative negative mg/dL   Urobilinogen, UA 0.2 0.2 or 1.0 E.U./dL   Nitrite, UA Negative Negative   Leukocytes, UA Small (1+) (A) Negative    Assessment and Plan :   PDMP not reviewed this encounter.  1. Acute cystitis without hematuria   2. Acute suprapubic pain   3. Acute vaginitis      Recommended treating for acute cystitis with cephalexin .  Will also treat empirically for BV infection given her ongoing vaginal discharge, history.  Use fluconazole  for antibiotic associated yeast infection.  Labs pending.  Counseled patient on potential for adverse effects with medications prescribed/recommended today, ER and return-to-clinic precautions discussed, patient verbalized understanding.     [1]  Allergies Allergen Reactions   Nickel Rash     Christopher Savannah, PA-C 06/12/24 1340  "

## 2024-06-12 NOTE — ED Triage Notes (Signed)
 Pt states that she has some lower, right abdominal pain. X2 days  Pt states that the pain is worst when using the bathroom.   Pt denies taking any otc medications.

## 2024-06-14 ENCOUNTER — Ambulatory Visit (HOSPITAL_COMMUNITY): Payer: Self-pay

## 2024-06-14 LAB — URINE CULTURE: Culture: 40000 — AB
